# Patient Record
Sex: Female | Born: 1945 | Race: White | Hispanic: No | Marital: Married | Smoking: Never smoker
Health system: Southern US, Community
[De-identification: ages and names within clinical notes are randomized; demographics above are authoritative.]

## PROBLEM LIST (undated history)

## (undated) DIAGNOSIS — K219 Gastro-esophageal reflux disease without esophagitis: Secondary | ICD-10-CM

## (undated) DIAGNOSIS — E785 Hyperlipidemia, unspecified: Secondary | ICD-10-CM

## (undated) DIAGNOSIS — R42 Dizziness and giddiness: Secondary | ICD-10-CM

## (undated) DIAGNOSIS — F32A Depression, unspecified: Secondary | ICD-10-CM

## (undated) DIAGNOSIS — C50919 Malignant neoplasm of unspecified site of unspecified female breast: Secondary | ICD-10-CM

## (undated) DIAGNOSIS — R9431 Abnormal electrocardiogram [ECG] [EKG]: Secondary | ICD-10-CM

## (undated) DIAGNOSIS — I340 Nonrheumatic mitral (valve) insufficiency: Secondary | ICD-10-CM

## (undated) DIAGNOSIS — Z9119 Patient's noncompliance with other medical treatment and regimen: Secondary | ICD-10-CM

## (undated) DIAGNOSIS — E119 Type 2 diabetes mellitus without complications: Secondary | ICD-10-CM

## (undated) DIAGNOSIS — O2230 Deep phlebothrombosis in pregnancy, unspecified trimester: Secondary | ICD-10-CM

## (undated) DIAGNOSIS — Z923 Personal history of irradiation: Secondary | ICD-10-CM

## (undated) DIAGNOSIS — F419 Anxiety disorder, unspecified: Secondary | ICD-10-CM

## (undated) DIAGNOSIS — F329 Major depressive disorder, single episode, unspecified: Secondary | ICD-10-CM

## (undated) HISTORY — DX: Deep phlebothrombosis in pregnancy, unspecified trimester: O22.30

## (undated) HISTORY — DX: Nonrheumatic mitral (valve) insufficiency: I34.0

## (undated) HISTORY — DX: Dizziness and giddiness: R42

## (undated) HISTORY — DX: Anxiety disorder, unspecified: F41.9

## (undated) HISTORY — DX: Hyperlipidemia, unspecified: E78.5

## (undated) HISTORY — PX: OTHER SURGICAL HISTORY: SHX169

## (undated) HISTORY — DX: Gastro-esophageal reflux disease without esophagitis: K21.9

## (undated) HISTORY — PX: APPENDECTOMY: SHX54

## (undated) HISTORY — DX: Type 2 diabetes mellitus without complications: E11.9

## (undated) HISTORY — DX: Patient's noncompliance with other medical treatment and regimen: Z91.19

## (undated) HISTORY — DX: Major depressive disorder, single episode, unspecified: F32.9

## (undated) HISTORY — DX: Abnormal electrocardiogram (ECG) (EKG): R94.31

## (undated) HISTORY — DX: Depression, unspecified: F32.A

## (undated) HISTORY — PX: TUBAL LIGATION: SHX77

## (undated) NOTE — *Deleted (*Deleted)
[facility]  [address] [place],  [place]  [address] [phone]  Clinic Day:  05/25/2020  Referring physician: Carylon Perches, MD   This document serves as a record of services personally performed by Gery Pray, MD. It was created on their behalf by Curry,Lauren E, a trained medical scribe. The creation of this record is based on the scribe's personal observations and the provider's statements to them.   CHIEF COMPLAINT:  CC: History of stage I right breast cancer  Current Treatment:  Surveillance   HISTORY OF PRESENT ILLNESS:  Olivia Mora is a 45 y.o. female with a history of stage I right breast cancer was diagnosed in 2016 after she presented with bleeding from the nipple.  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  Estrogen receptor was positive at 100% and progesterone receptor positive at 50% with a Ki 67 of 20% and HER2 negative.  She had a left partial mastectomy by Dr. Ovidio Kin on May 13, 2015 with findings of a 5 mm invasive breast cancer of the right breast with 2 negative nodes for a T1a N0 M0, stage IA.  She was given radiation, which was completed in June of 2017.  She was tried on multiple types of hormonal therapy including anastrozole from February 2017 to May, then letrozole from June to August and then tamoxifen, which she later stopped approximately 9 months ago.  She did not tolerate the hormonal therapy.  She has never had any evidence of recurrence.  She had a mammogram on Nov 16, 2017 which was negative.  She had menarche at age 33 and menopause in her late 94's.  She was on hormone replacement therapy for 20 years.  She is here today for a follow up and states that she was in the emergency room back in June.  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  She has been having some dizziness recently, which could be caused by one of her medications.  She has requested a change in her nerve pill, and I discussed with her that she would need to follow up with her family doctor for this.  She has had some constipation, and I have recommended Mirilax to treat this.  Her appetite is good, and she has gained 2 pounds since her last visit.  She denies fever, or chills.  She denies sore throat, cough, and dyspnea.  She denies nausea, vomiting, and bowel issues.  She also denies chest pain, abdominal pain, or pain elsewhere.  She is on B12 injections monthly in addition to her usual medications.   INTERVAL HISTORY:  Olivia Mora is here for annual follow up and states that she has been well.  Annual mammogram from August was clear.   Her  appetite is good, and she has gained/lost _ pounds since her last visit.  She denies fever, chills or other signs of infection.  She denies nausea, vomiting, bowel issues, or abdominal pain.  She denies sore throat, cough, dyspnea, or chest pain.   REVIEW OF SYSTEMS:  Review of Systems - Oncology   VITALS:  There were no vitals taken for this visit.  Wt Readings from Last 3 Encounters:  09/12/17 162 lb 1.6 oz (73.5 kg)  02/23/17 163 lb (73.9  kg)  02/17/17 165 lb 12.8 oz (75.2 kg)    There is no height or weight on file to calculate BMI.  Performance status (ECOG): {CHL ONC [id number]  PHYSICAL EXAM:  Physical Exam  LABS:   CBC Latest Ref Rng & Units 05/22/2018 09/12/2017 02/13/2017  WBC 4.0 - 10.5 K/uL 4.8 6.8 5.2  Hemoglobin 12.0 - 15.0 g/dL 21.3 08.6 57.8  Hematocrit 36 - 46 % 37.9 39.4 40.3  Platelets 150 - 400 K/uL 250 241 168   CMP Latest Ref Rng & Units 05/22/2018 09/12/2017 02/23/2017  Glucose 70 - 99 mg/dL 469(G) 295(M) 841(L)  BUN 8 - 23 mg/dL 16 18 11   Creatinine 0.44 - 1.00 mg/dL 2.44 0.10 2.72  Sodium 135 - 145 mmol/L 143 137 144  Potassium 3.5 -  5.1 mmol/L 4.2 3.9 4.5  Chloride 98 - 111 mmol/L 110 101 107(H)  CO2 22 - 32 mmol/L 24 23 22   Calcium 8.9 - 10.3 mg/dL 9.3 9.0 9.4  Total Protein 6.5 - 8.1 g/dL 6.6 6.5 5.3(G)  Total Bilirubin 0.3 - 1.2 mg/dL 0.5 0.4 <6.4  Alkaline Phos 38 - 126 U/L 50 52 48  AST 15 - 41 U/L 20 23 17   ALT 0 - 44 U/L 17 17 14      No results found for: CEA1 / No results found for: CEA1 No results found for: PSA1 No results found for: QIH474 No results found for: CAN125  No results found for: TOTALPROTELP, ALBUMINELP, A1GS, A2GS, BETS, BETA2SER, GAMS, MSPIKE, SPEI Lab Results  Component Value Date   FERRITIN 54 02/23/2017   No results found for: LDH   STUDIES:   She underwent a digital diagnostic bilateral mammogram with tomography on 03/11/2020 showing: breast density category B.  No mammographic evidence of malignancy involving either breast.  Expected post lumpectomy changes involving the RIGHT breast.   Allergies: No Known Allergies  Current Medications: Current Outpatient Medications  Medication Sig Dispense Refill  . ALPRAZolam (XANAX) 1 MG tablet Take 1 mg by mouth at bedtime as needed for sleep. Reported on 11/26/2015    . cetirizine (ZYRTEC) 10 MG tablet Take 10 mg by mouth daily.    . cholecalciferol (VITAMIN D) 1000 units tablet Take 1,000 Units by mouth daily.    . clonazePAM (KLONOPIN) 1 MG tablet Take 1 mg by mouth 2 (two) times daily.     . Cyanocobalamin (VITAMIN B-12 IJ) Inject 1 mL as directed every 30 (thirty) days.     Marland Kitchen glipiZIDE (GLUCOTROL XL) 5 MG 24 hr tablet TK 1 T PO QD  4  . insulin glargine (LANTUS) 100 UNIT/ML injection Inject 40 Units into the skin daily.     Marland Kitchen LANTUS SOLOSTAR 100 UNIT/ML Solostar Pen INJECT 40 UNITS EVERY MORNING  12  . meclizine (ANTIVERT) 25 MG tablet TAKE 1 TABLET BY MOUTH THREE TIMES DAILY AS NEEDED FOR DIZZINESS  5  . metFORMIN (GLUCOPHAGE) 1000 MG tablet Take 1,000 mg by mouth 2 (two) times daily with a meal. Alternates 1-2 a day    .  Multiple Minerals-Vitamins (CALCIUM & VIT D3 BONE HEALTH PO) Take 1 tablet by mouth daily.    . simvastatin (ZOCOR) 40 MG tablet Take 40 mg by mouth every evening. Reported on 09/25/2015    . tamoxifen (NOLVADEX) 20 MG tablet Take 1 tablet (20 mg total) by mouth daily. 90 tablet 3  . traZODone (DESYREL) 100 MG tablet Take 100-200 mg by mouth at bedtime.     Marland Kitchen venlafaxine  XR (EFFEXOR-XR) 75 MG 24 hr capsule 3  qam 90 capsule 3  . XARELTO 20 MG TABS tablet Take 20 mg by mouth daily.  12   No current facility-administered medications for this visit.     ASSESSMENT & PLAN:   Assessment:   1. Stage IA invasive right breast cancer diagnosed in 2016 and treated with surgery, radiation and partial hormonal therapy.  She has no evidence of disease.  2. Severe hot flashes despite Effexor at 150 mg daily.  We have added clonidine 0.1 mg daily.  3. 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  She is currently on Xarelto and is negative for any hereditary hypercoagulability.  4. Chronic depression which is worsened by the fact that she is now widowed this year and grieving.  5. Hip pain which appears to be osteoarthritis.  I will refer her to the orthopedic surgeon for evaluation and treatment.  Plan: She was unable to tolerate hormonal therapy but fortunately had a very small 5 mm lesion and should do well regardless.  Since she is 4 years out from her diagnoses, we can switch her to yearly visits.  I have referred her to Dr. Loralie Champagne.  I can see her back in 1 year with bilateral mammogram.  She understands and agrees with this plan of care.     I provided *** minutes (8:44 AM - 8:44 AM) of face-to-face time during this this encounter and > 50% was spent counseling as documented under my assessment and plan.    Olivia Beckwith, MD [facility] AT [facility] [address] [place] [address] Dept: [phone] Dept Fax:  [phone]   I, Foye Deer, am acting as scribe for Olivia Beckwith, MD  I have reviewed this report as typed by the medical scribe, and it is complete and accurate.

---

## 2001-01-29 ENCOUNTER — Encounter: Payer: Self-pay | Admitting: Family Medicine

## 2001-01-29 ENCOUNTER — Emergency Department (HOSPITAL_COMMUNITY): Admission: EM | Admit: 2001-01-29 | Discharge: 2001-01-29 | Payer: Self-pay | Admitting: Emergency Medicine

## 2001-01-29 ENCOUNTER — Ambulatory Visit (HOSPITAL_COMMUNITY): Admission: RE | Admit: 2001-01-29 | Discharge: 2001-01-29 | Payer: Self-pay | Admitting: Family Medicine

## 2001-02-06 ENCOUNTER — Ambulatory Visit (HOSPITAL_COMMUNITY): Admission: RE | Admit: 2001-02-06 | Discharge: 2001-02-06 | Payer: Self-pay | Admitting: Internal Medicine

## 2001-05-29 ENCOUNTER — Ambulatory Visit (HOSPITAL_COMMUNITY): Admission: RE | Admit: 2001-05-29 | Discharge: 2001-05-29 | Payer: Self-pay | Admitting: Family Medicine

## 2002-02-21 ENCOUNTER — Emergency Department (HOSPITAL_COMMUNITY): Admission: EM | Admit: 2002-02-21 | Discharge: 2002-02-21 | Payer: Self-pay | Admitting: Emergency Medicine

## 2002-02-22 ENCOUNTER — Ambulatory Visit (HOSPITAL_COMMUNITY): Admission: RE | Admit: 2002-02-22 | Discharge: 2002-02-22 | Payer: Self-pay | Admitting: Emergency Medicine

## 2002-02-22 ENCOUNTER — Encounter: Payer: Self-pay | Admitting: Emergency Medicine

## 2003-10-03 ENCOUNTER — Emergency Department (HOSPITAL_COMMUNITY): Admission: EM | Admit: 2003-10-03 | Discharge: 2003-10-03 | Payer: Self-pay | Admitting: Emergency Medicine

## 2004-03-30 ENCOUNTER — Ambulatory Visit (HOSPITAL_COMMUNITY): Admission: RE | Admit: 2004-03-30 | Discharge: 2004-03-30 | Payer: Self-pay | Admitting: Family Medicine

## 2004-11-02 ENCOUNTER — Ambulatory Visit (HOSPITAL_COMMUNITY): Admission: RE | Admit: 2004-11-02 | Discharge: 2004-11-02 | Payer: Self-pay | Admitting: Specialist

## 2004-11-02 IMAGING — US US PELVIS COMPLETE MODIFY
1 series · 14 of 25 positions shown · non-contrast
Comparison: none

CLINICAL DATA: Heavy periods. Postmenopausal bleeding.

[Series 1: unknown · 0.40mm/px · 14 of 60 slices shown]
[im 1/60]
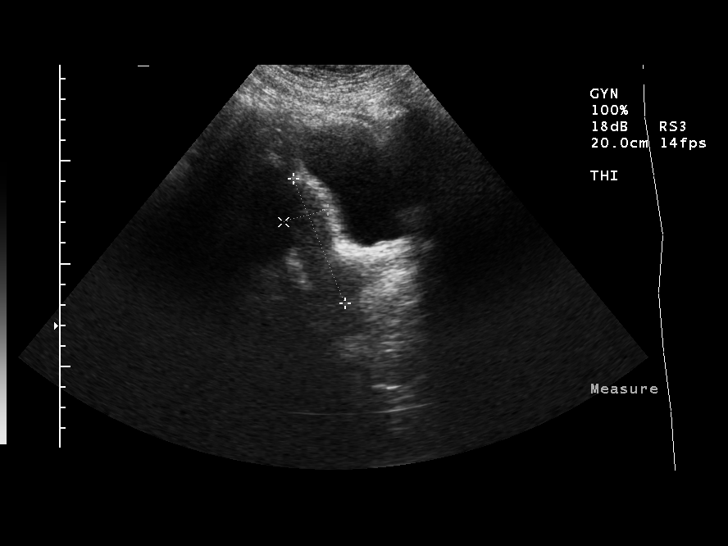
[im 5/60]
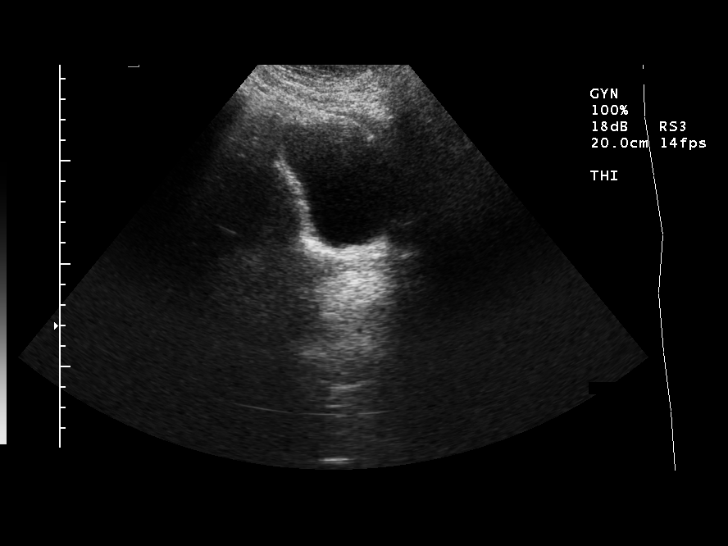
[im 10/60]
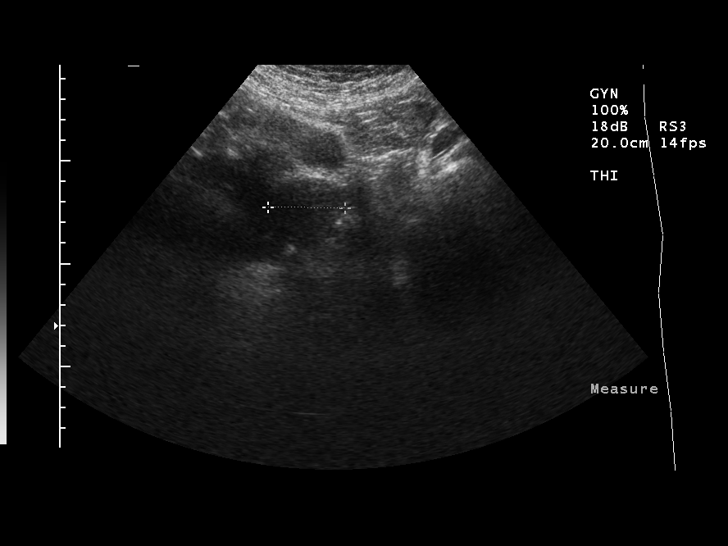
[im 15/60]
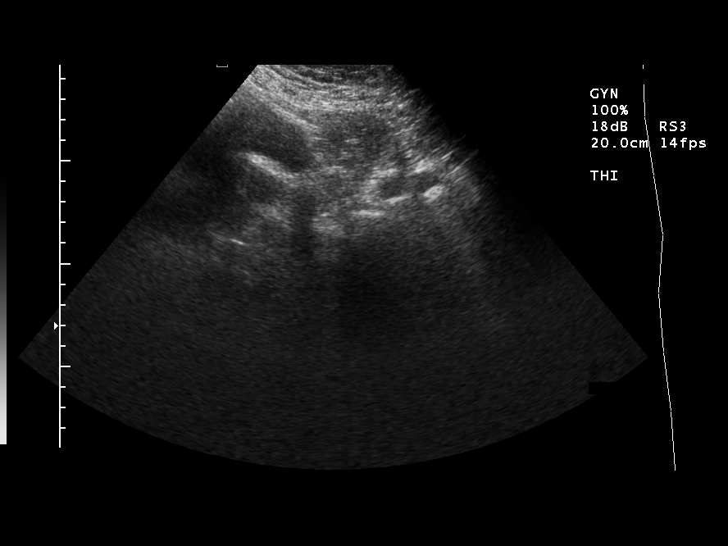
[im 20/60]
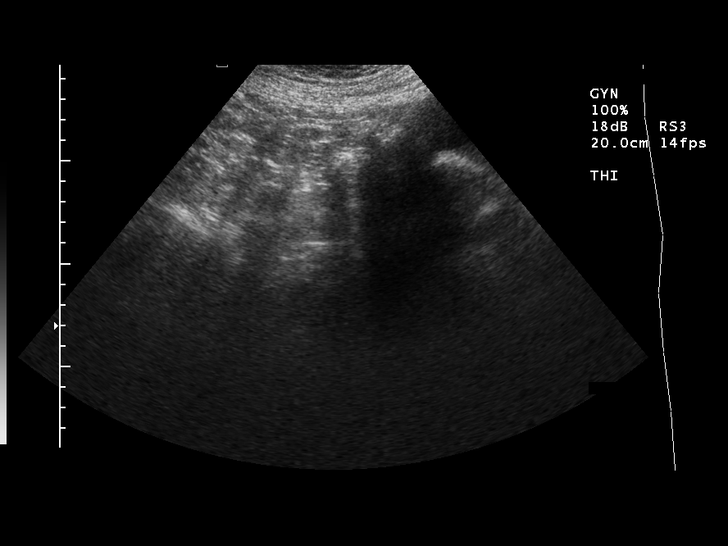
[im 23/60]
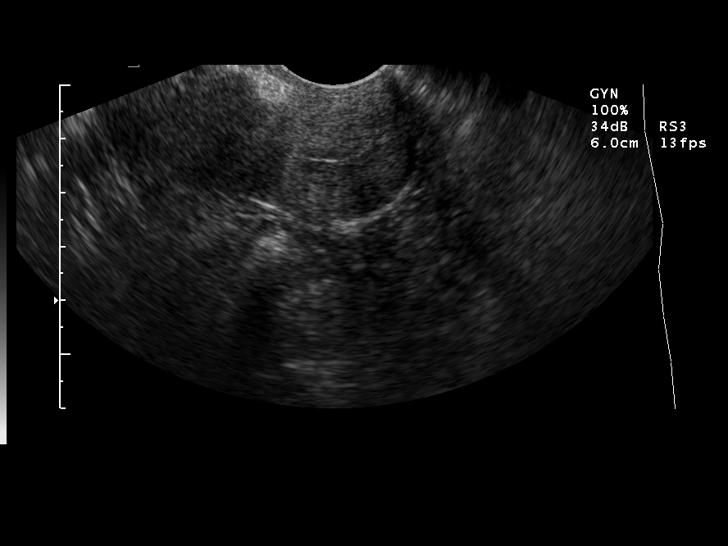
[im 28/60]
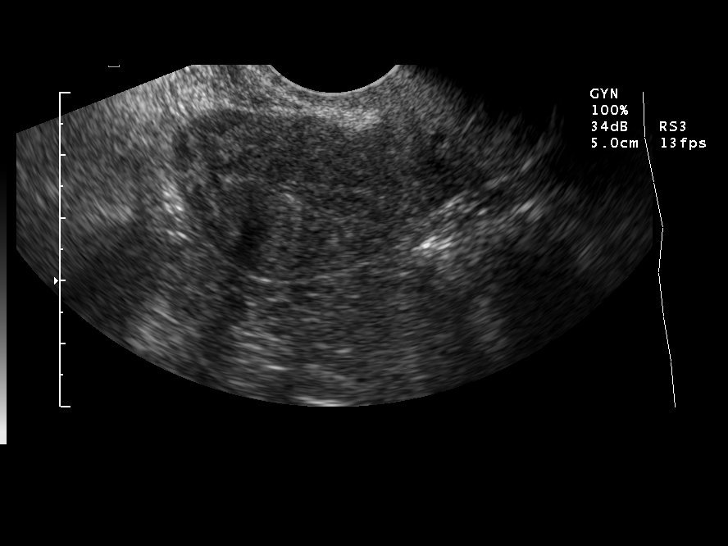
[im 32/60]
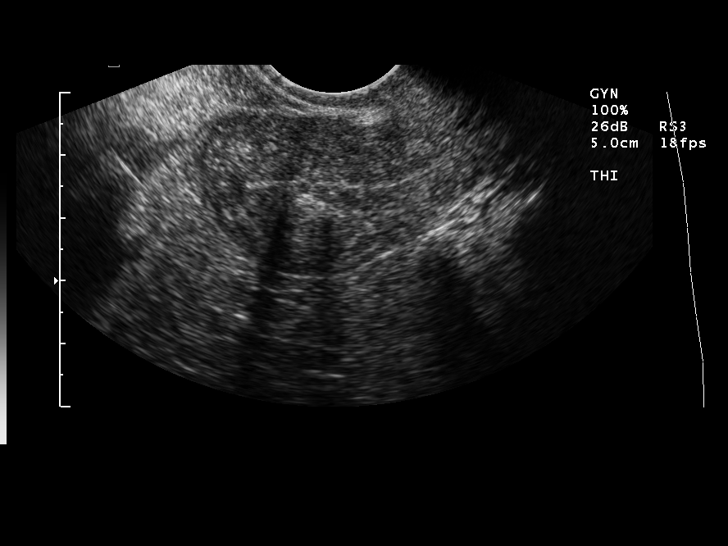
[im 37/60]
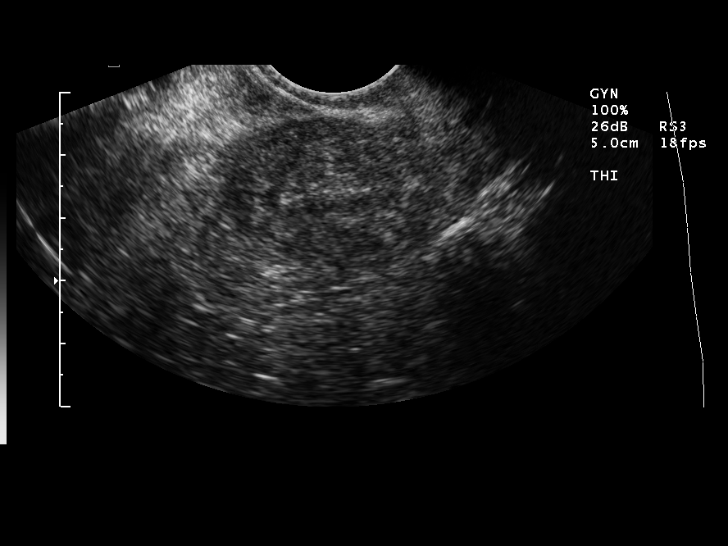
[im 40/60]
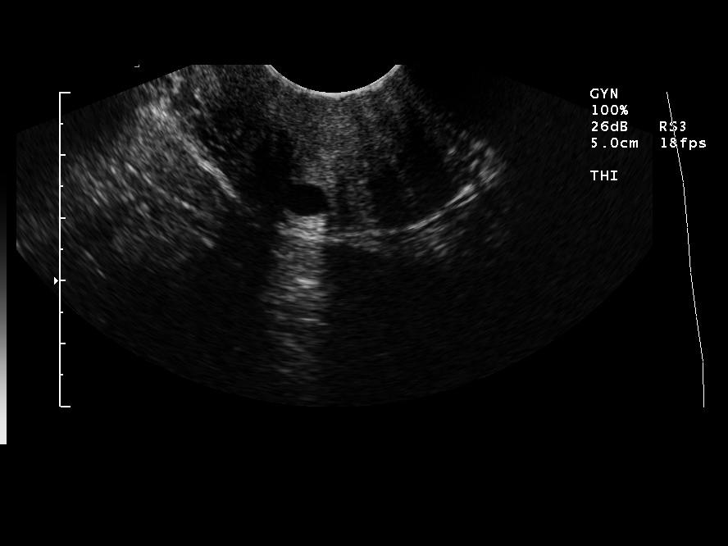
[im 45/60]
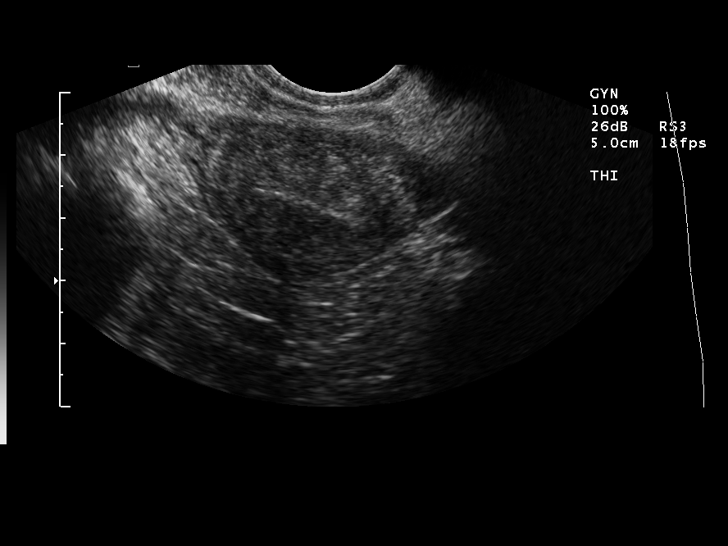
[im 50/60]
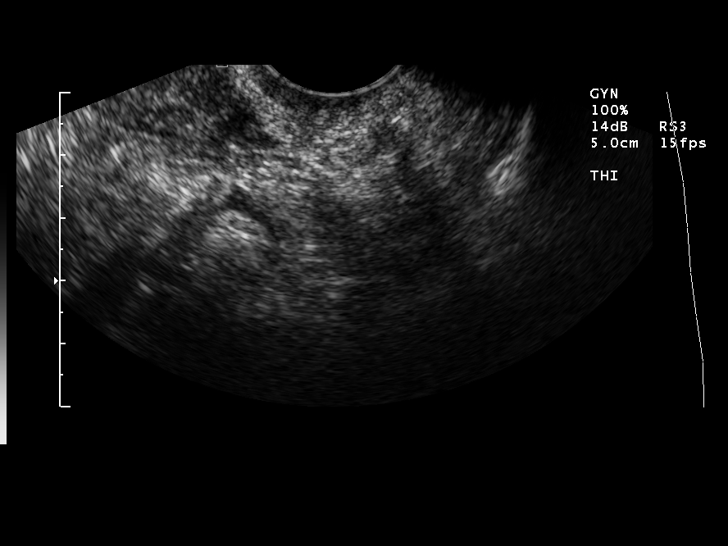
[im 55/60]
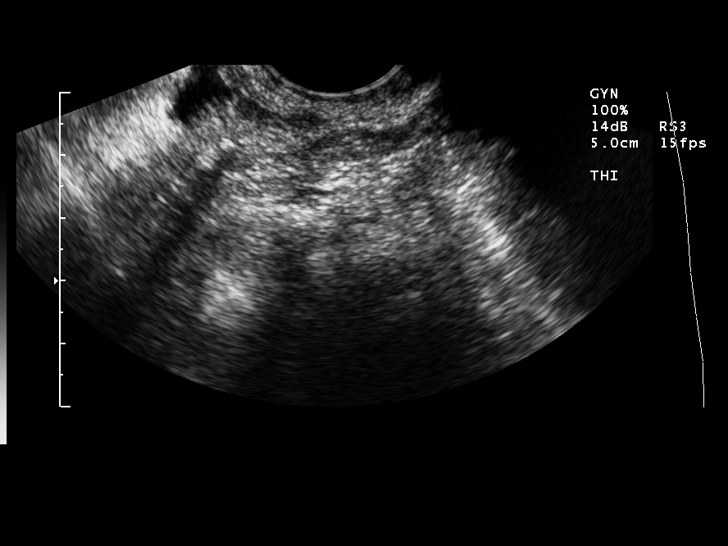
[im 60/60]
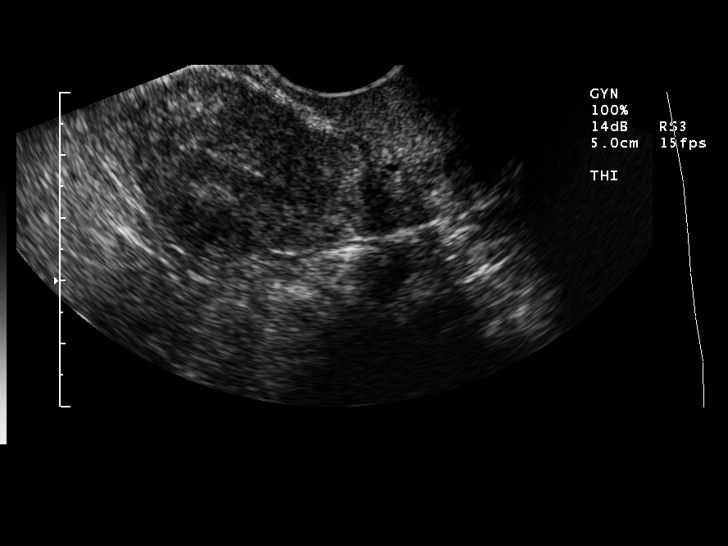

[14 of 25 positions shown; findings below may reference images not displayed]

Pelvic and transvaginal ultrasound:

Uterus measures 2.3 x 3.7 x 6.6 cm, with an unremarkable appearing endometrial
stripe 2.3 mm in total thickness. There is a partially calcified 11 x 14 mm mass
probably fibroid in the posterior wall of the uterus. No other discrete masses
are identified. Neither ovary is seen on pelvic or transvaginal scanning. No
adnexal masses evident. No free fluid identified.
IMPRESSION: 1. Probable 14 mm degenerating intramural uterine fibroid. This does not appear
to significantly distort  the endometrial cavity.
2. Ovaries are not identified. If there is   clinical concern of ovarian
pathology, MRI may be useful for further evaluation.

## 2007-03-09 ENCOUNTER — Ambulatory Visit (HOSPITAL_COMMUNITY): Admission: RE | Admit: 2007-03-09 | Discharge: 2007-03-09 | Payer: Self-pay | Admitting: Internal Medicine

## 2008-06-13 ENCOUNTER — Ambulatory Visit (HOSPITAL_COMMUNITY): Admission: RE | Admit: 2008-06-13 | Discharge: 2008-06-13 | Payer: Self-pay | Admitting: Internal Medicine

## 2010-08-08 ENCOUNTER — Encounter: Payer: Self-pay | Admitting: Internal Medicine

## 2012-10-13 ENCOUNTER — Encounter: Payer: Self-pay | Admitting: Physician Assistant

## 2012-10-14 DIAGNOSIS — R9431 Abnormal electrocardiogram [ECG] [EKG]: Secondary | ICD-10-CM

## 2012-10-15 ENCOUNTER — Encounter: Payer: Self-pay | Admitting: Physician Assistant

## 2012-10-19 ENCOUNTER — Encounter: Payer: Self-pay | Admitting: Cardiology

## 2012-10-19 ENCOUNTER — Ambulatory Visit (INDEPENDENT_AMBULATORY_CARE_PROVIDER_SITE_OTHER): Payer: Medicare Other | Admitting: *Deleted

## 2012-10-19 DIAGNOSIS — Z7901 Long term (current) use of anticoagulants: Secondary | ICD-10-CM

## 2012-10-19 DIAGNOSIS — I2699 Other pulmonary embolism without acute cor pulmonale: Secondary | ICD-10-CM

## 2012-10-19 DIAGNOSIS — I4891 Unspecified atrial fibrillation: Secondary | ICD-10-CM

## 2012-10-19 LAB — POCT INR: INR: 1.7

## 2012-10-22 ENCOUNTER — Ambulatory Visit (INDEPENDENT_AMBULATORY_CARE_PROVIDER_SITE_OTHER): Payer: Medicare Other | Admitting: *Deleted

## 2012-10-22 ENCOUNTER — Telehealth: Payer: Self-pay | Admitting: *Deleted

## 2012-10-22 DIAGNOSIS — I4891 Unspecified atrial fibrillation: Secondary | ICD-10-CM

## 2012-10-22 DIAGNOSIS — I2699 Other pulmonary embolism without acute cor pulmonale: Secondary | ICD-10-CM

## 2012-10-22 DIAGNOSIS — Z7901 Long term (current) use of anticoagulants: Secondary | ICD-10-CM

## 2012-10-22 NOTE — Telephone Encounter (Signed)
Called pt back.  She is keeping appt for today.

## 2012-10-22 NOTE — Telephone Encounter (Signed)
Please return call / tgs °

## 2012-10-30 ENCOUNTER — Ambulatory Visit (INDEPENDENT_AMBULATORY_CARE_PROVIDER_SITE_OTHER): Payer: Medicare Other | Admitting: *Deleted

## 2012-10-30 DIAGNOSIS — Z7901 Long term (current) use of anticoagulants: Secondary | ICD-10-CM

## 2012-10-30 DIAGNOSIS — I2699 Other pulmonary embolism without acute cor pulmonale: Secondary | ICD-10-CM

## 2012-10-30 DIAGNOSIS — I4891 Unspecified atrial fibrillation: Secondary | ICD-10-CM

## 2012-11-01 ENCOUNTER — Telehealth: Payer: Self-pay | Admitting: Physician Assistant

## 2012-11-01 NOTE — Telephone Encounter (Signed)
PATIENT HAS NOT RECEIVED A MONITOR TO WEAR

## 2012-11-05 ENCOUNTER — Encounter: Payer: Self-pay | Admitting: *Deleted

## 2012-11-05 NOTE — Telephone Encounter (Signed)
Mailbox full cannot accept messages

## 2012-11-06 NOTE — Telephone Encounter (Signed)
Discussed below with patient.  States saw her PMD (Dr. Cliffton Asters - [facility]) yesterday & they put a monitor on her.  States she will wear x 2 weeks.  Changed eph from 4/28 to 5/12 to allow for monitor to be completed.  Will have her INR checked again at this OV as she can not afford to pay $45 every time she comes.  States she is looking for new PMD her in [place].

## 2012-11-12 ENCOUNTER — Encounter: Payer: Medicare Other | Admitting: Physician Assistant

## 2012-11-26 ENCOUNTER — Ambulatory Visit (INDEPENDENT_AMBULATORY_CARE_PROVIDER_SITE_OTHER): Payer: Medicare Other | Admitting: Physician Assistant

## 2012-11-26 ENCOUNTER — Ambulatory Visit (INDEPENDENT_AMBULATORY_CARE_PROVIDER_SITE_OTHER): Payer: Medicare Other | Admitting: *Deleted

## 2012-11-26 ENCOUNTER — Encounter: Payer: Self-pay | Admitting: Physician Assistant

## 2012-11-26 VITALS — BP 108/70 | HR 77 | Ht 64.0 in | Wt 166.4 lb

## 2012-11-26 DIAGNOSIS — Z7901 Long term (current) use of anticoagulants: Secondary | ICD-10-CM

## 2012-11-26 DIAGNOSIS — I2699 Other pulmonary embolism without acute cor pulmonale: Secondary | ICD-10-CM

## 2012-11-26 DIAGNOSIS — I059 Rheumatic mitral valve disease, unspecified: Secondary | ICD-10-CM

## 2012-11-26 DIAGNOSIS — I4891 Unspecified atrial fibrillation: Secondary | ICD-10-CM

## 2012-11-26 DIAGNOSIS — R9431 Abnormal electrocardiogram [ECG] [EKG]: Secondary | ICD-10-CM | POA: Insufficient documentation

## 2012-11-26 DIAGNOSIS — I340 Nonrheumatic mitral (valve) insufficiency: Secondary | ICD-10-CM | POA: Insufficient documentation

## 2012-11-26 NOTE — Assessment & Plan Note (Signed)
Assessed as mild/moderate, by recent echocardiogram. Continue to monitor clinically.

## 2012-11-26 NOTE — Assessment & Plan Note (Addendum)
No further workup indicated. Results of outpatient event monitoring have been reviewed, revealing no evidence of dysrhythmia or heart block. Recent echo indicated NL LVF. Therefore, this suggests that the original EKG, which prompted the cardiac evaluation, was a misreading by the computer and, in fact, artifactual, as determined at time of initial consultation.

## 2012-11-26 NOTE — Assessment & Plan Note (Signed)
Patient is currently being followed here in our Coumadin clinic for ongoing monitoring/management of recently documented pulmonary embolus. No evidence of RV strain by echocardiography. Recommend that she continue treatment for a full 6 months. Of note, however, she also has history of remote DVT, previously treated with Coumadin.

## 2012-11-26 NOTE — Patient Instructions (Signed)
Continue all current medications. Your physician wants you to follow up in: 6 months.  You will receive a reminder letter in the mail one-two months in advance.  If you don't receive a letter, please call our office to schedule the follow up appointment   

## 2012-11-26 NOTE — Progress Notes (Signed)
Primary Cardiologist: Simona Huh, MD (new)   HPI: Post hospital followup from [facility], status post recent evaluation of an abnormal EKG.   Patient was initially seen in consultation by the cardiology Fellow, Dr. Hilary Hertz, on April 30th, who felt that the EKG most likely represented artifact, rather than true complete heart block. There was no subsequent demonstrated dysrhythmia on telemetry. Patient presented with multiple CRFs, but no known ischemic heart disease, and serial cardiac markers were all within NL limits. Echocardiogram was obtained, results as follows:   - EF 50-55% with diastolic dysfunction; mild/moderate MR; PHTN (RVSP 25-30 mmHg); normal RVF  Of note, patient was diagnosed with acute pulmonary embolus, following elevated d-dimer. She also had history of remote DVT, previously treated with Coumadin.  We recommended further outpatient evaluation with a 21 day event monitor. Rhythm strips were reviewed today, revealing NSR and ST, but no atrial dysrhythmia or evidence of heart block.   Patient presents today reporting no exertional CP, DOE, or tachycardia palpitations. She has since established here in our Coumadin clinic, for ongoing monitoring/management. She is also in the process of establishing with Dr. Carylon Perches in [place], [place], as her new primary M.D., after moving here to [facility] from [place], approximately 6 months ago.  No Known Allergies  Current Outpatient Prescriptions  Medication Sig Dispense Refill  . ALPRAZolam (XANAX) 1 MG tablet Take 1 mg by mouth 3 (three) times daily as needed for sleep.      . clonazePAM (KLONOPIN) 1 MG tablet Take 1 mg by mouth 4 (four) times daily.      . Cyanocobalamin (VITAMIN B-12 IJ) Inject 1 mL as directed every 30 (thirty) days.       . folic acid (FOLVITE) 1 MG tablet Take 1 mg by mouth daily.      Marland Kitchen glipiZIDE (GLUCOTROL) 10 MG tablet Take 10 mg by mouth 2 (two) times daily before a meal.      . HYDROcodone-acetaminophen (NORCO)  10-325 MG per tablet Take 1 tablet by mouth every 6 (six) hours as needed for pain.      . hydrOXYzine (ATARAX/VISTARIL) 10 MG tablet Take 10 mg by mouth daily.      . insulin glargine (LANTUS) 100 UNIT/ML injection Inject 18 Units into the skin at bedtime.      . metFORMIN (GLUCOPHAGE) 1000 MG tablet Take 1,000 mg by mouth 2 (two) times daily with a meal.      . ranitidine (ZANTAC) 150 MG tablet Take 150 mg by mouth as needed for heartburn.      . simvastatin (ZOCOR) 40 MG tablet Take 40 mg by mouth every evening.      . traZODone (DESYREL) 100 MG tablet Take 100 mg by mouth at bedtime.       No current facility-administered medications for this visit.    Past Medical History  Diagnosis Date  . Diabetes   . Hyperlipidemia   . Anxiety   . GERD (gastroesophageal reflux disease)   . DVT (deep vein thrombosis) in pregnancy   . Abnormal EKG   . Mitral regurgitation     mild/moderate, echo, 10/2011    No past surgical history on file.  History   Social History  . Marital Status: Married    Spouse Name: N/A    Number of Children: N/A  . Years of Education: N/A   Occupational History  . Not on file.   Social History Main Topics  . Smoking status: Never Smoker   .  Smokeless tobacco: Not on file  . Alcohol Use: Not on file  . Drug Use: Not on file  . Sexually Active: Not on file   Other Topics Concern  . Not on file   Social History Narrative  . No narrative on file    Family History  Problem Relation Age of Onset  . Heart attack Brother 45  . CAD      Several family memebers  . Heart disease      Several family memebers    ROS: no nausea, vomiting; no fever, chills; no melena, hematochezia; no claudication  PHYSICAL EXAM: BP 108/70  Pulse 77  Ht 5\' 4"  (1.626 m)  Wt 166 lb 6.4 oz (75.479 kg)  BMI 28.55 kg/m2  SpO2 96% GENERAL: 67 year old female; NAD HEENT: NCAT, PERRLA, EOMI; sclera clear; no xanthelasma NECK: palpable bilateral carotid pulses, no bruits;  no JVD; no TM LUNGS: CTA bilaterally CARDIAC: RRR (S1, S2); no significant murmurs; no rubs or gallops ABDOMEN: soft, non-tender; intact BS EXTREMETIES: no significant peripheral edema SKIN: warm/dry; no obvious rash/lesions MUSCULOSKELETAL: no joint deformity NEURO: no focal deficit; NL affect   EKG:    ASSESSMENT & PLAN:  Abnormal EKG No further workup indicated. Results of outpatient event monitoring have been reviewed, revealing no evidence of dysrhythmia or heart block. Recent echo indicated NL LVF. Therefore, this suggests that the original EKG, which prompted the cardiac evaluation, was a misreading by the computer and, in fact, artifactual, as determined at time of initial consultation.   Mitral regurgitation Assessed as mild/moderate, by recent echocardiogram. Continue to monitor clinically.  Other pulmonary embolism and infarction Patient is currently being followed here in our Coumadin clinic for ongoing monitoring/management of recently documented pulmonary embolus. No evidence of RV strain by echocardiography. Recommend that she continue treatment for a full 6 months. Of note, however, she also has history of remote DVT, previously treated with Coumadin.    Gene Julias Mould, PAC

## 2012-11-29 ENCOUNTER — Other Ambulatory Visit: Payer: Self-pay | Admitting: *Deleted

## 2012-11-29 DIAGNOSIS — R002 Palpitations: Secondary | ICD-10-CM

## 2012-12-11 ENCOUNTER — Ambulatory Visit (INDEPENDENT_AMBULATORY_CARE_PROVIDER_SITE_OTHER): Payer: Medicare Other | Admitting: *Deleted

## 2012-12-11 DIAGNOSIS — I2699 Other pulmonary embolism without acute cor pulmonale: Secondary | ICD-10-CM

## 2012-12-11 DIAGNOSIS — I4891 Unspecified atrial fibrillation: Secondary | ICD-10-CM

## 2012-12-11 DIAGNOSIS — Z7901 Long term (current) use of anticoagulants: Secondary | ICD-10-CM

## 2012-12-24 DIAGNOSIS — R42 Dizziness and giddiness: Secondary | ICD-10-CM

## 2013-01-01 ENCOUNTER — Encounter: Payer: Self-pay | Admitting: *Deleted

## 2013-05-14 ENCOUNTER — Other Ambulatory Visit: Payer: Self-pay | Admitting: Obstetrics & Gynecology

## 2013-06-10 ENCOUNTER — Ambulatory Visit (INDEPENDENT_AMBULATORY_CARE_PROVIDER_SITE_OTHER): Payer: Medicare Other | Admitting: Obstetrics & Gynecology

## 2013-06-10 ENCOUNTER — Other Ambulatory Visit (HOSPITAL_COMMUNITY)
Admission: RE | Admit: 2013-06-10 | Discharge: 2013-06-10 | Disposition: A | Discharge status: Home / Self Care | Payer: Medicare Other | Source: Ambulatory Visit | Attending: Obstetrics & Gynecology | Admitting: Obstetrics & Gynecology

## 2013-06-10 ENCOUNTER — Encounter: Payer: Self-pay | Admitting: Obstetrics & Gynecology

## 2013-06-10 VITALS — BP 110/70 | Ht 63.4 in | Wt 162.4 lb

## 2013-06-10 DIAGNOSIS — Z1212 Encounter for screening for malignant neoplasm of rectum: Secondary | ICD-10-CM

## 2013-06-10 DIAGNOSIS — Z01419 Encounter for gynecological examination (general) (routine) without abnormal findings: Secondary | ICD-10-CM

## 2013-06-10 DIAGNOSIS — Z124 Encounter for screening for malignant neoplasm of cervix: Secondary | ICD-10-CM

## 2013-06-10 NOTE — Addendum Note (Signed)
Addended by: Richardson Chiquito on: 06/10/2013 04:30 PM   Modules accepted: Orders

## 2013-06-10 NOTE — Progress Notes (Signed)
Patient ID: Olivia Mora, female   DOB: January 30, 1946, 67 y.o.   MRN: [id number] Subjective:     Olivia Mora is a 67 y.o. female here for a routine exam.  No LMP recorded. No obstetric history on file. Birth Control Method:  Post menopausal Menstrual Calendar(currently): pm  Current complaints: none.   Current acute medical issues:  none   Recent Gynecologic History No LMP recorded. Last Pap: 2013,  normal Last mammogram: 2013,  normal  Past Medical History  Diagnosis Date  . Diabetes   . Hyperlipidemia   . Anxiety   . GERD (gastroesophageal reflux disease)   . DVT (deep vein thrombosis) in pregnancy   . Abnormal EKG   . Mitral regurgitation     mild/moderate, echo, 10/2011    History reviewed. No pertinent past surgical history.  OB History   Grav Para Term Preterm Abortions TAB SAB Ect Mult Living                  History   Social History  . Marital Status: Married    Spouse Name: N/A    Number of Children: N/A  . Years of Education: N/A   Social History Main Topics  . Smoking status: Never Smoker   . Smokeless tobacco: None  . Alcohol Use: None  . Drug Use: None  . Sexual Activity: None   Other Topics Concern  . None   Social History Narrative  . None    Family History  Problem Relation Age of Onset  . Heart attack Brother 45  . CAD      Several family memebers  . Heart disease      Several family memebers     Review of Systems  Review of Systems  Constitutional: Negative for fever, chills, weight loss, malaise/fatigue and diaphoresis.  HENT: Negative for hearing loss, ear pain, nosebleeds, congestion, sore throat, neck pain, tinnitus and ear discharge.   Eyes: Negative for blurred vision, double vision, photophobia, pain, discharge and redness.  Respiratory: Negative for cough, hemoptysis, sputum production, shortness of breath, wheezing and stridor.   Cardiovascular: Negative for chest pain, palpitations, orthopnea, claudication, leg  swelling and PND.  Gastrointestinal: negative for abdominal pain. Negative for heartburn, nausea, vomiting, diarrhea, constipation, blood in stool and melena.  Genitourinary: Negative for dysuria, urgency, frequency, hematuria and flank pain.  Musculoskeletal: Negative for myalgias, back pain, joint pain and falls.  Skin: Negative for itching and rash.  Neurological: Negative for dizziness, tingling, tremors, sensory change, speech change, focal weakness, seizures, loss of consciousness, weakness and headaches.  Endo/Heme/Allergies: Negative for environmental allergies and polydipsia. Does not bruise/bleed easily.  Psychiatric/Behavioral: Negative for depression, suicidal ideas, hallucinations, memory loss and substance abuse. The patient is not nervous/anxious and does not have insomnia.        Objective:    Physical Exam  Vitals reviewed. Constitutional: She is oriented to person, place, and time. She appears well-developed and well-nourished.  HENT:  Head: Normocephalic and atraumatic.        Right Ear: External ear normal.  Left Ear: External ear normal.  Nose: Nose normal.  Mouth/Throat: Oropharynx is clear and moist.  Eyes: Conjunctivae and EOM are normal. Pupils are equal, round, and reactive to light. Right eye exhibits no discharge. Left eye exhibits no discharge. No scleral icterus.  Neck: Normal range of motion. Neck supple. No tracheal deviation present. No thyromegaly present.  Cardiovascular: Normal rate, regular rhythm, normal heart sounds and intact distal pulses.  Exam reveals no gallop and no friction rub.   No murmur heard. Respiratory: Effort normal and breath sounds normal. No respiratory distress. She has no wheezes. She has no rales. She exhibits no tenderness.  GI: Soft. Bowel sounds are normal. She exhibits no distension and no mass. There is no tenderness. There is no rebound and no guarding.  Genitourinary:  Breasts no masses skin changes or nipple changes  bilaterally      Vulva is normal without lesions Vagina is pink moist without discharge Cervix normal in appearance and pap is done Uterus is normal size shape and contour Adnexa is negative with normal sized ovaries  Rectal    hemoccult negative, normal tone, no masses  Musculoskeletal: Normal range of motion. She exhibits no edema and no tenderness.  Neurological: She is alert and oriented to person, place, and time. She has normal reflexes. She displays normal reflexes. No cranial nerve deficit. She exhibits normal muscle tone. Coordination normal.  Skin: Skin is warm and dry. No rash noted. No erythema. No pallor.  Psychiatric: She has a normal mood and affect. Her behavior is normal. Judgment and thought content normal.       Assessment:    Healthy female exam.    Plan:    Follow up in: 1 year.

## 2014-01-07 ENCOUNTER — Other Ambulatory Visit (HOSPITAL_COMMUNITY): Payer: Self-pay | Admitting: Internal Medicine

## 2014-01-07 DIAGNOSIS — Z1231 Encounter for screening mammogram for malignant neoplasm of breast: Secondary | ICD-10-CM

## 2014-01-13 ENCOUNTER — Ambulatory Visit (HOSPITAL_COMMUNITY): Payer: Medicare Other

## 2014-01-20 ENCOUNTER — Ambulatory Visit (HOSPITAL_COMMUNITY)
Admission: RE | Admit: 2014-01-20 | Discharge: 2014-01-20 | Disposition: A | Discharge status: Home / Self Care | Payer: Medicare Other | Source: Ambulatory Visit | Attending: Internal Medicine | Admitting: Internal Medicine

## 2014-01-20 ENCOUNTER — Ambulatory Visit (HOSPITAL_COMMUNITY): Payer: Medicare Other

## 2014-01-20 DIAGNOSIS — Z1231 Encounter for screening mammogram for malignant neoplasm of breast: Secondary | ICD-10-CM | POA: Insufficient documentation

## 2014-07-18 DIAGNOSIS — C50919 Malignant neoplasm of unspecified site of unspecified female breast: Secondary | ICD-10-CM

## 2014-07-18 DIAGNOSIS — Z923 Personal history of irradiation: Secondary | ICD-10-CM

## 2014-07-18 HISTORY — DX: Malignant neoplasm of unspecified site of unspecified female breast: C50.919

## 2014-07-18 HISTORY — PX: BREAST LUMPECTOMY: SHX2

## 2014-07-18 HISTORY — DX: Personal history of irradiation: Z92.3

## 2014-08-18 DIAGNOSIS — Z7901 Long term (current) use of anticoagulants: Secondary | ICD-10-CM | POA: Diagnosis not present

## 2014-09-05 DIAGNOSIS — Z86711 Personal history of pulmonary embolism: Secondary | ICD-10-CM | POA: Diagnosis not present

## 2014-09-05 DIAGNOSIS — F419 Anxiety disorder, unspecified: Secondary | ICD-10-CM | POA: Diagnosis not present

## 2014-09-05 DIAGNOSIS — Z23 Encounter for immunization: Secondary | ICD-10-CM | POA: Diagnosis not present

## 2014-09-05 DIAGNOSIS — E119 Type 2 diabetes mellitus without complications: Secondary | ICD-10-CM | POA: Diagnosis not present

## 2014-10-14 ENCOUNTER — Emergency Department (HOSPITAL_COMMUNITY)
Admission: EM | Admit: 2014-10-14 | Discharge: 2014-10-15 | Disposition: A | Discharge status: Home / Self Care | Payer: Medicare Other | Attending: Emergency Medicine | Admitting: Emergency Medicine

## 2014-10-14 ENCOUNTER — Emergency Department (HOSPITAL_COMMUNITY): Payer: Medicare Other

## 2014-10-14 DIAGNOSIS — R2990 Unspecified symptoms and signs involving the nervous system: Secondary | ICD-10-CM | POA: Diagnosis not present

## 2014-10-14 DIAGNOSIS — K219 Gastro-esophageal reflux disease without esophagitis: Secondary | ICD-10-CM | POA: Diagnosis not present

## 2014-10-14 DIAGNOSIS — E119 Type 2 diabetes mellitus without complications: Secondary | ICD-10-CM | POA: Insufficient documentation

## 2014-10-14 DIAGNOSIS — Z7901 Long term (current) use of anticoagulants: Secondary | ICD-10-CM | POA: Diagnosis not present

## 2014-10-14 DIAGNOSIS — R2689 Other abnormalities of gait and mobility: Secondary | ICD-10-CM | POA: Diagnosis not present

## 2014-10-14 DIAGNOSIS — R42 Dizziness and giddiness: Secondary | ICD-10-CM | POA: Diagnosis not present

## 2014-10-14 DIAGNOSIS — E785 Hyperlipidemia, unspecified: Secondary | ICD-10-CM | POA: Diagnosis not present

## 2014-10-14 DIAGNOSIS — M7989 Other specified soft tissue disorders: Secondary | ICD-10-CM | POA: Diagnosis not present

## 2014-10-14 DIAGNOSIS — Z86718 Personal history of other venous thrombosis and embolism: Secondary | ICD-10-CM | POA: Diagnosis not present

## 2014-10-14 DIAGNOSIS — Z794 Long term (current) use of insulin: Secondary | ICD-10-CM | POA: Diagnosis not present

## 2014-10-14 DIAGNOSIS — Z79899 Other long term (current) drug therapy: Secondary | ICD-10-CM | POA: Insufficient documentation

## 2014-10-14 DIAGNOSIS — R410 Disorientation, unspecified: Secondary | ICD-10-CM | POA: Insufficient documentation

## 2014-10-14 DIAGNOSIS — H9192 Unspecified hearing loss, left ear: Secondary | ICD-10-CM | POA: Insufficient documentation

## 2014-10-14 DIAGNOSIS — Z8679 Personal history of other diseases of the circulatory system: Secondary | ICD-10-CM | POA: Diagnosis not present

## 2014-10-14 LAB — COMPREHENSIVE METABOLIC PANEL
ALBUMIN: 3.8 g/dL (ref 3.5–5.2)
ALK PHOS: 49 U/L (ref 39–117)
ALT: 20 U/L (ref 0–35)
ANION GAP: 11 (ref 5–15)
AST: 19 U/L (ref 0–37)
BILIRUBIN TOTAL: 0.4 mg/dL (ref 0.3–1.2)
BUN: 14 mg/dL (ref 6–23)
CHLORIDE: 106 mmol/L (ref 96–112)
CO2: 23 mmol/L (ref 19–32)
CREATININE: 0.93 mg/dL (ref 0.50–1.10)
Calcium: 9.4 mg/dL (ref 8.4–10.5)
GFR, EST AFRICAN AMERICAN: 72 mL/min — AB (ref >90)
GFR, EST NON AFRICAN AMERICAN: 62 mL/min — AB (ref >90)
Glucose, Bld: 254 mg/dL — ABNORMAL HIGH (ref 70–99)
Potassium: 3.8 mmol/L (ref 3.5–5.1)
Sodium: 140 mmol/L (ref 135–145)
TOTAL PROTEIN: 5.5 g/dL — AB (ref 6.0–8.3)

## 2014-10-14 LAB — CBC WITH DIFFERENTIAL/PLATELET
BASOS ABS: 0 10*3/uL (ref 0.0–0.1)
Basophils Relative: 0 % (ref 0–1)
EOS ABS: 0.1 10*3/uL (ref 0.0–0.7)
EOS PCT: 1 % (ref 0–5)
HCT: 37.2 % (ref 36.0–46.0)
Hemoglobin: 12.5 g/dL (ref 12.0–15.0)
LYMPHS PCT: 43 % (ref 12–46)
Lymphs Abs: 2.5 10*3/uL (ref 0.7–4.0)
MCH: 30 pg (ref 26.0–34.0)
MCHC: 33.6 g/dL (ref 30.0–36.0)
MCV: 89.2 fL (ref 78.0–100.0)
MONOS PCT: 6 % (ref 3–12)
Monocytes Absolute: 0.3 10*3/uL (ref 0.1–1.0)
Neutro Abs: 2.9 10*3/uL (ref 1.7–7.7)
Neutrophils Relative %: 50 % (ref 43–77)
Platelets: 231 10*3/uL (ref 150–400)
RBC: 4.17 MIL/uL (ref 3.87–5.11)
RDW: 13.6 % (ref 11.5–15.5)
WBC: 5.8 10*3/uL (ref 4.0–10.5)

## 2014-10-14 NOTE — ED Notes (Signed)
C/o a sl headache 

## 2014-10-14 NOTE — ED Notes (Signed)
emt reports patient very unsteady on transfer to bed.

## 2014-10-14 NOTE — ED Notes (Signed)
The pt has had dizziness for one year.  She was diagnosed with vertigo.  She was driving today and she feels like she was leaning to the lt side.  She was sent here from an urgent care in [facility].  She reports that she is not thinking striaght

## 2014-10-15 ENCOUNTER — Emergency Department (HOSPITAL_COMMUNITY): Payer: Medicare Other

## 2014-10-15 DIAGNOSIS — R410 Disorientation, unspecified: Secondary | ICD-10-CM | POA: Diagnosis not present

## 2014-10-15 DIAGNOSIS — R42 Dizziness and giddiness: Secondary | ICD-10-CM | POA: Diagnosis not present

## 2014-10-15 LAB — PROTIME-INR
INR: 2.23 — AB (ref 0.00–1.49)
Prothrombin Time: 24.9 seconds — ABNORMAL HIGH (ref 11.6–15.2)

## 2014-10-15 MED ORDER — MECLIZINE HCL 50 MG PO TABS: 25.0000 mg | ORAL_TABLET | Freq: Three times a day (TID) | ORAL | Status: DC | PRN

## 2014-10-15 MED ORDER — SODIUM CHLORIDE 0.9 % IV BOLUS (SEPSIS)
1000.0000 mL | INTRAVENOUS | Status: AC
  Administered 2014-10-15: 1000 mL via INTRAVENOUS

## 2014-10-15 NOTE — ED Notes (Signed)
Patient gone to MRI.

## 2014-10-15 NOTE — Discharge Instructions (Signed)

## 2014-10-15 NOTE — ED Provider Notes (Signed)
CSN: [id number]     Arrival date & time 10/14/14  2042 History  This chart was scribed for Pamella Pert, MD by Delphia Grates, ED Scribe. This patient was seen in room B18C/B18C and the patient's care was started at 12:50 AM.   Chief Complaint  Patient presents with  . Dizziness    Patient is a 69 y.o. female presenting with dizziness. The history is provided by the patient. No language interpreter was used.  Dizziness Quality:  Imbalance and vertigo Severity:  Moderate Onset quality:  Gradual Timing:  Intermittent Progression:  Worsening Chronicity:  Recurrent Relieved by:  None tried Associated symptoms: hearing loss (left ear)   Associated symptoms: no chest pain, no diarrhea, no headaches, no nausea, no shortness of breath and no vomiting   Risk factors: hx of vertigo      HPI Comments: Olivia Mora is a 69 y.o. female, with history of DM, HLD, anxiety, DVT, GERD, who presents to the Emergency Department complaining of worsening dizziness (unbalanced) for the past year, that has gotten worse in the last 2-3 weeks. Patient reports history of vertigo that was diagnosed 1 year ago and reports subsequent hearing loss in the left ear. She notes intermittent, worsening episodes of dizziness since this time. She states she still feels dizzy currently and notes deep breathing exacerbates her symptoms. She also mentions the left side her head "does not feel right" and describes it as she is being "pulled to the left" when ambulating. Patient states she is uncoordinated and states she is now having difficulty driving and decided to come to the ED to be evaluated.  Patient is also complaining of worsening confusion and states she will enter a room in the house and forget her reason for coming in the room. She has not been evaluated since her diagnosis of vertigo. She denies dysuria. Patient is currently on Coumadin.     Past Medical History  Diagnosis Date  . Diabetes   .  Hyperlipidemia   . Anxiety   . GERD (gastroesophageal reflux disease)   . DVT (deep vein thrombosis) in pregnancy   . Abnormal EKG   . Mitral regurgitation     mild/moderate, echo, 10/2011   No past surgical history on file. Family History  Problem Relation Age of Onset  . Heart attack Brother 75  . CAD      Several family memebers  . Heart disease      Several family memebers   History  Substance Use Topics  . Smoking status: Never Smoker   . Smokeless tobacco: Not on file  . Alcohol Use: Not on file   OB History    No data available     Review of Systems  Constitutional: Negative for fever and chills.  HENT: Positive for hearing loss (left ear). Negative for rhinorrhea and sore throat.   Eyes: Negative for visual disturbance.  Respiratory: Negative for cough and shortness of breath.   Cardiovascular: Negative for chest pain and leg swelling.  Gastrointestinal: Negative for nausea, vomiting, abdominal pain and diarrhea.  Genitourinary: Negative for dysuria.  Musculoskeletal: Positive for gait problem. Negative for back pain and neck pain.  Skin: Negative for rash.  Neurological: Positive for dizziness. Negative for light-headedness and headaches.  Hematological: Bruises/bleeds easily (on Coumadin).  Psychiatric/Behavioral: Positive for confusion.      Allergies  Review of patient's allergies indicates no known allergies.  Home Medications   Prior to Admission medications   Medication Sig Start Date  End Date Taking? Authorizing Provider  ALPRAZolam Duanne Moron) 1 MG tablet Take 1 mg by mouth 3 (three) times daily as needed for sleep.   Yes Historical Provider, MD  clonazePAM (KLONOPIN) 1 MG tablet Take 1 mg by mouth 4 (four) times daily.   Yes Historical Provider, MD  Cyanocobalamin (VITAMIN B-12 IJ) Inject 1 mL as directed every 30 (thirty) days.    Yes Historical Provider, MD  glipiZIDE (GLUCOTROL) 10 MG tablet Take 10 mg by mouth 2 (two) times daily before a meal.    Yes Historical Provider, MD  hydrOXYzine (ATARAX/VISTARIL) 10 MG tablet Take 10 mg by mouth daily.   Yes Historical Provider, MD  insulin glargine (LANTUS) 100 UNIT/ML injection Inject 18 Units into the skin at bedtime.   Yes Historical Provider, MD  metFORMIN (GLUCOPHAGE) 1000 MG tablet Take 1,000 mg by mouth 2 (two) times daily with a meal.   Yes Historical Provider, MD  ranitidine (ZANTAC) 150 MG tablet Take 150 mg by mouth as needed for heartburn.   Yes Historical Provider, MD  simvastatin (ZOCOR) 40 MG tablet Take 40 mg by mouth every evening.   Yes Historical Provider, MD  traZODone (DESYREL) 100 MG tablet Take 100 mg by mouth at bedtime.   Yes Historical Provider, MD  warfarin (COUMADIN) 4 MG tablet Take 4 mg by mouth daily.   Yes Historical Provider, MD   Triage Vitals: BP 111/66 mmHg  Pulse 54  Temp(Src) 97.6 F (36.4 C) (Oral)  Resp 17  Ht 5\' 3"  (1.6 m)  Wt 158 lb (71.668 kg)  BMI 28.00 kg/m2  SpO2 97%  Physical Exam  Constitutional: She is oriented to person, place, and time. She appears well-developed and well-nourished. No distress.  HENT:  Head: Normocephalic and atraumatic.  Right Ear: Hearing normal.  Left Ear: Hearing normal.  Nose: Nose normal.  Mouth/Throat: Oropharynx is clear and moist and mucous membranes are normal.  Eyes: Conjunctivae and EOM are normal. Pupils are equal, round, and reactive to light.  Neck: Normal range of motion. Neck supple.  Cardiovascular: Normal rate, regular rhythm, S1 normal, S2 normal, normal heart sounds and intact distal pulses.  Exam reveals no gallop and no friction rub.   No murmur heard. Pulmonary/Chest: Effort normal and breath sounds normal. No respiratory distress. She exhibits no tenderness.  Abdominal: Soft. Normal appearance and bowel sounds are normal. There is no hepatosplenomegaly. There is no tenderness. There is no rebound, no guarding, no tenderness at McBurney's point and negative Murphy's sign. No hernia.   Musculoskeletal: Normal range of motion.  Neurological: She is alert and oriented to person, place, and time. She has normal strength. No cranial nerve deficit or sensory deficit. Coordination normal. GCS eye subscore is 4. GCS verbal subscore is 5. GCS motor subscore is 6.  alert, oriented x3 speech: normal in context and clarity memory: intact grossly cranial nerves II-XII: intact motor strength: full proximally and distally no involuntary movements or tremors sensation: intact to light touch diffusely  cerebellar: finger-to-nose and heel-to-shin intact gait: normal forwards and backwards  Skin: Skin is warm, dry and intact. No rash noted. No cyanosis.  Psychiatric: She has a normal mood and affect. Her speech is normal and behavior is normal. Thought content normal.  Nursing note and vitals reviewed.   ED Course  Procedures (including critical care time)  DIAGNOSTIC STUDIES: Oxygen Saturation is 97% on room air, adequate by my interpretation.    COORDINATION OF CARE: At 0102 Discussed treatment plan with patient  which includes MRI and  IV fluids. Patient agrees.   Labs Review Labs Reviewed  COMPREHENSIVE METABOLIC PANEL - Abnormal; Notable for the following:    Glucose, Bld 254 (*)    Total Protein 5.5 (*)    GFR calc non Af Amer 62 (*)    GFR calc Af Amer 72 (*)    All other components within normal limits  PROTIME-INR - Abnormal; Notable for the following:    Prothrombin Time 24.9 (*)    INR 2.23 (*)    All other components within normal limits  CBC WITH DIFFERENTIAL/PLATELET    Imaging Review Ct Head Wo Contrast  10/14/2014   CLINICAL DATA:  Dizziness.  EXAM: CT HEAD WITHOUT CONTRAST  TECHNIQUE: Contiguous axial images were obtained from the base of the skull through the vertex without intravenous contrast.  COMPARISON:  Head CT 12/24/2012, brain MRI 02/08/2013  FINDINGS: No intracranial hemorrhage, mass effect, or midline shift. No hydrocephalus. The basilar  cisterns are patent. No evidence of territorial infarct. No intracranial fluid collection. Mild generalized atrophy, stable. Calvarium is intact. Included paranasal sinuses and mastoid air cells are well aerated.  IMPRESSION: No acute intracranial abnormality.   Electronically Signed   By: Jeb Levering M.D.   On: 10/14/2014 22:40     EKG Interpretation   Date/Time:  Tuesday October 14 2014 21:11:24 EDT Ventricular Rate:  62 PR Interval:  150 QRS Duration: 88 QT Interval:  442 QTC Calculation: 448 R Axis:   -7 Text Interpretation:  Normal sinus rhythm Low voltage QRS Cannot rule out  Anterior infarct , age undetermined No significant change since last  tracing Confirmed by Abena Erdman  MD, Jatziry Wechter ([id number]) on 10/15/2014 6:13:40 AM      MDM   Final diagnoses:  Dizziness    6:09 AM 69 y.o. female w hx of DM, HLP, DVT on coumadin who presents as work worsening dizziness over the last few weeks. She denies any fevers, headache injuries, or falls. She is alert and oriented 3 on my exam without evidence of confusion. She states that she sometimes walks into the kitchen and forgets what she was going to do. I do not think she is grossly confused. Her vital signs are unremarkable here. She notes that she has had intermittent dizziness since a severe episode of vertigo approximately one year ago. She is also had some hearing loss in her left ear after that episode of dizziness. Screening labs and imaging thus far noncontributory. The patient's symptoms sound to be chronic but will get MRI to rule out acute infarct.  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 She continues to appear well on exam. She was ambulatory for me without difficulty.  I have discussed the diagnosis/risks/treatment options with the patient and believe the pt to be eligible for discharge home to follow-up with her pcp. I prescribed the patient meclizine but warned her to use it only as needed and not to  ambulate after taking it. We also discussed returning to the ED immediately if new or worsening sx occur. We discussed the sx which are most concerning (e.g., worsening dizziness, fever, falls ) that necessitate immediate return. Medications administered to the patient during their visit and any new prescriptions provided to the patient are listed below.  Medications given during this visit Medications  sodium chloride 0.9 % bolus 1,000 mL (0 mLs Intravenous Stopped 10/15/14 0500)    Discharge Medication List as of 10/15/2014  4:52 AM  START taking these medications   Details  meclizine (ANTIVERT) 50 MG tablet Take 0.5 tablets (25 mg total) by mouth 3 (three) times daily as needed for dizziness., Starting 10/15/2014, Until Discontinued, Print           I personally performed the services described in this documentation, which was scribed in my presence. The recorded information has been reviewed and is accurate.    Pamella Pert, MD 10/15/14 [phone]

## 2014-10-21 DIAGNOSIS — Z7901 Long term (current) use of anticoagulants: Secondary | ICD-10-CM | POA: Diagnosis not present

## 2014-11-13 ENCOUNTER — Ambulatory Visit: Payer: Self-pay | Admitting: *Deleted

## 2014-11-18 DIAGNOSIS — Z7901 Long term (current) use of anticoagulants: Secondary | ICD-10-CM | POA: Diagnosis not present

## 2014-11-27 DIAGNOSIS — F329 Major depressive disorder, single episode, unspecified: Secondary | ICD-10-CM | POA: Diagnosis not present

## 2014-11-27 DIAGNOSIS — Z8679 Personal history of other diseases of the circulatory system: Secondary | ICD-10-CM | POA: Diagnosis not present

## 2014-11-27 DIAGNOSIS — E785 Hyperlipidemia, unspecified: Secondary | ICD-10-CM | POA: Diagnosis not present

## 2014-11-27 DIAGNOSIS — E119 Type 2 diabetes mellitus without complications: Secondary | ICD-10-CM | POA: Diagnosis not present

## 2014-11-27 DIAGNOSIS — Z79899 Other long term (current) drug therapy: Secondary | ICD-10-CM | POA: Diagnosis not present

## 2014-12-02 ENCOUNTER — Other Ambulatory Visit: Payer: Self-pay | Admitting: Obstetrics & Gynecology

## 2014-12-02 ENCOUNTER — Other Ambulatory Visit (HOSPITAL_COMMUNITY)
Admission: RE | Admit: 2014-12-02 | Discharge: 2014-12-02 | Disposition: A | Discharge status: Home / Self Care | Payer: Medicare Other | Source: Ambulatory Visit | Attending: Obstetrics & Gynecology | Admitting: Obstetrics & Gynecology

## 2014-12-02 ENCOUNTER — Ambulatory Visit (INDEPENDENT_AMBULATORY_CARE_PROVIDER_SITE_OTHER): Payer: Medicare Other | Admitting: Obstetrics & Gynecology

## 2014-12-02 ENCOUNTER — Encounter: Payer: Self-pay | Admitting: Obstetrics & Gynecology

## 2014-12-02 VITALS — BP 120/70 | HR 80 | Ht 63.0 in | Wt 158.0 lb

## 2014-12-02 DIAGNOSIS — N6452 Nipple discharge: Secondary | ICD-10-CM

## 2014-12-02 NOTE — Addendum Note (Signed)
Addended by: Farley Ly on: 12/02/2014 01:41 PM   Modules accepted: Orders

## 2014-12-02 NOTE — Progress Notes (Signed)
Patient ID: Olivia Mora, female   DOB: 02-22-1946, 69 y.o.   MRN: [id number]  Chief Complaint  Patient presents with  . gyn vist    blood coming out rt nipple x 2 week.c/c want something sinus.    2 weeks right bloody nipple discharge  No pain  No masses  Never had before  Mammogram last July was negative  Exam Right breast without skin changes No masses on exam Easy to express bloody breast discharge  evalauted under microscope and found to be ellular debris and red blood cells, no fat  Sent to cytopathology for evaluation Discussed with dr Owens Shark who recommended diagnostic right mammography followed by ductogram most probably, that is ordered today

## 2014-12-02 NOTE — Addendum Note (Signed)
Addended by: Farley Ly on: 12/02/2014 01:33 PM   Modules accepted: Orders

## 2014-12-04 DIAGNOSIS — Z86711 Personal history of pulmonary embolism: Secondary | ICD-10-CM | POA: Diagnosis not present

## 2014-12-04 DIAGNOSIS — Z7901 Long term (current) use of anticoagulants: Secondary | ICD-10-CM | POA: Diagnosis not present

## 2014-12-04 DIAGNOSIS — E785 Hyperlipidemia, unspecified: Secondary | ICD-10-CM | POA: Diagnosis not present

## 2014-12-04 DIAGNOSIS — E119 Type 2 diabetes mellitus without complications: Secondary | ICD-10-CM | POA: Diagnosis not present

## 2014-12-08 ENCOUNTER — Ambulatory Visit
Admission: RE | Admit: 2014-12-08 | Discharge: 2014-12-08 | Disposition: A | Discharge status: Home / Self Care | Payer: Medicare Other | Source: Ambulatory Visit | Attending: Obstetrics & Gynecology | Admitting: Obstetrics & Gynecology

## 2014-12-08 ENCOUNTER — Other Ambulatory Visit: Payer: Self-pay | Admitting: Obstetrics & Gynecology

## 2014-12-08 DIAGNOSIS — N6452 Nipple discharge: Secondary | ICD-10-CM | POA: Diagnosis not present

## 2014-12-11 DIAGNOSIS — Z7901 Long term (current) use of anticoagulants: Secondary | ICD-10-CM | POA: Diagnosis not present

## 2014-12-19 DIAGNOSIS — Z7901 Long term (current) use of anticoagulants: Secondary | ICD-10-CM | POA: Diagnosis not present

## 2015-01-08 ENCOUNTER — Ambulatory Visit
Admission: RE | Admit: 2015-01-08 | Discharge: 2015-01-08 | Disposition: A | Discharge status: Home / Self Care | Payer: Medicare Other | Source: Ambulatory Visit | Attending: Obstetrics & Gynecology | Admitting: Obstetrics & Gynecology

## 2015-01-08 DIAGNOSIS — N6452 Nipple discharge: Secondary | ICD-10-CM

## 2015-01-08 DIAGNOSIS — Z7901 Long term (current) use of anticoagulants: Secondary | ICD-10-CM | POA: Diagnosis not present

## 2015-01-22 ENCOUNTER — Telehealth: Payer: Self-pay | Admitting: *Deleted

## 2015-01-22 NOTE — Telephone Encounter (Signed)
Based on the note from her ductogram she already has an appt on July 11 with breast surgeon, they set that up So as far as i know she has an appt  I called the patient and relayed as much

## 2015-01-22 NOTE — Telephone Encounter (Signed)
Pt states did she need to have any follow up with bloody discharge from breast nipple. Pt states she had mammogram but is questioning whether or not need to be referred to surgeon.

## 2015-01-23 DIAGNOSIS — Z7901 Long term (current) use of anticoagulants: Secondary | ICD-10-CM | POA: Diagnosis not present

## 2015-02-10 DIAGNOSIS — Z7901 Long term (current) use of anticoagulants: Secondary | ICD-10-CM | POA: Diagnosis not present

## 2015-02-13 ENCOUNTER — Other Ambulatory Visit: Payer: Self-pay | Admitting: Surgery

## 2015-02-13 DIAGNOSIS — N6452 Nipple discharge: Secondary | ICD-10-CM | POA: Diagnosis not present

## 2015-02-23 ENCOUNTER — Ambulatory Visit: Payer: Medicare Other

## 2015-02-23 ENCOUNTER — Other Ambulatory Visit: Payer: Self-pay

## 2015-02-23 DIAGNOSIS — Z1231 Encounter for screening mammogram for malignant neoplasm of breast: Secondary | ICD-10-CM

## 2015-02-24 ENCOUNTER — Other Ambulatory Visit: Payer: Medicare Other

## 2015-03-06 ENCOUNTER — Ambulatory Visit
Admission: RE | Admit: 2015-03-06 | Discharge: 2015-03-06 | Disposition: A | Discharge status: Home / Self Care | Payer: Medicare Other | Source: Ambulatory Visit | Attending: Surgery | Admitting: Surgery

## 2015-03-06 ENCOUNTER — Ambulatory Visit
Admission: RE | Admit: 2015-03-06 | Discharge: 2015-03-06 | Disposition: A | Discharge status: Home / Self Care | Payer: Medicare Other | Source: Ambulatory Visit

## 2015-03-06 DIAGNOSIS — N63 Unspecified lump in breast: Secondary | ICD-10-CM | POA: Diagnosis not present

## 2015-03-06 DIAGNOSIS — N6041 Mammary duct ectasia of right breast: Secondary | ICD-10-CM | POA: Diagnosis not present

## 2015-03-06 DIAGNOSIS — Z1231 Encounter for screening mammogram for malignant neoplasm of breast: Secondary | ICD-10-CM | POA: Diagnosis not present

## 2015-03-06 MED ORDER — GADOBENATE DIMEGLUMINE 529 MG/ML IV SOLN: 14.0000 mL | Freq: Once | INTRAVENOUS | Status: DC | PRN

## 2015-03-09 ENCOUNTER — Other Ambulatory Visit: Payer: Self-pay | Admitting: Surgery

## 2015-03-09 DIAGNOSIS — N63 Unspecified lump in unspecified breast: Secondary | ICD-10-CM

## 2015-03-10 DIAGNOSIS — Z7901 Long term (current) use of anticoagulants: Secondary | ICD-10-CM | POA: Diagnosis not present

## 2015-03-10 DIAGNOSIS — E119 Type 2 diabetes mellitus without complications: Secondary | ICD-10-CM | POA: Diagnosis not present

## 2015-03-12 ENCOUNTER — Other Ambulatory Visit: Payer: Self-pay | Admitting: Surgery

## 2015-03-12 DIAGNOSIS — N63 Unspecified lump in unspecified breast: Secondary | ICD-10-CM

## 2015-03-12 DIAGNOSIS — N6452 Nipple discharge: Secondary | ICD-10-CM

## 2015-03-17 ENCOUNTER — Ambulatory Visit
Admission: RE | Admit: 2015-03-17 | Discharge: 2015-03-17 | Disposition: A | Discharge status: Home / Self Care | Payer: Medicare Other | Source: Ambulatory Visit | Attending: Surgery | Admitting: Surgery

## 2015-03-17 DIAGNOSIS — N6489 Other specified disorders of breast: Secondary | ICD-10-CM | POA: Diagnosis not present

## 2015-03-26 ENCOUNTER — Other Ambulatory Visit: Payer: Self-pay | Admitting: Surgery

## 2015-03-26 DIAGNOSIS — R928 Other abnormal and inconclusive findings on diagnostic imaging of breast: Secondary | ICD-10-CM

## 2015-03-27 ENCOUNTER — Telehealth: Payer: Self-pay

## 2015-03-27 NOTE — Telephone Encounter (Signed)
Spoke with the patient regarding stopping Coumadin for her MR breast biopsy scheduled on 04-02-15.  Dr. [name] advised that the patient may stop Coumadin for 4 days prior to biopsy. The patient was instructed to take her last Coumadin on Saturday 03-28-15.  The patient was informed the Radiologist doing the MR biopsy would instruct her on when to re-start Coumadin.  The patient verbalized an understanding of these instructions.

## 2015-03-30 ENCOUNTER — Other Ambulatory Visit: Payer: Self-pay | Admitting: Surgery

## 2015-03-30 DIAGNOSIS — N63 Unspecified lump in unspecified breast: Secondary | ICD-10-CM

## 2015-03-31 ENCOUNTER — Other Ambulatory Visit: Payer: Self-pay | Admitting: Surgery

## 2015-03-31 DIAGNOSIS — N63 Unspecified lump in unspecified breast: Secondary | ICD-10-CM

## 2015-04-01 ENCOUNTER — Other Ambulatory Visit: Payer: Self-pay | Admitting: Surgery

## 2015-04-01 DIAGNOSIS — R928 Other abnormal and inconclusive findings on diagnostic imaging of breast: Secondary | ICD-10-CM

## 2015-04-02 ENCOUNTER — Ambulatory Visit
Admission: RE | Admit: 2015-04-02 | Discharge: 2015-04-02 | Disposition: A | Discharge status: Home / Self Care | Payer: Medicare Other | Source: Ambulatory Visit | Attending: Surgery | Admitting: Surgery

## 2015-04-02 DIAGNOSIS — R928 Other abnormal and inconclusive findings on diagnostic imaging of breast: Secondary | ICD-10-CM

## 2015-04-02 DIAGNOSIS — C50511 Malignant neoplasm of lower-outer quadrant of right female breast: Secondary | ICD-10-CM | POA: Diagnosis not present

## 2015-04-02 DIAGNOSIS — D242 Benign neoplasm of left breast: Secondary | ICD-10-CM | POA: Diagnosis not present

## 2015-04-02 DIAGNOSIS — N6489 Other specified disorders of breast: Secondary | ICD-10-CM | POA: Diagnosis not present

## 2015-04-02 DIAGNOSIS — N63 Unspecified lump in breast: Secondary | ICD-10-CM | POA: Diagnosis not present

## 2015-04-02 MED ORDER — GADOBENATE DIMEGLUMINE 529 MG/ML IV SOLN
14.0000 mL | Freq: Once | INTRAVENOUS | Status: AC | PRN
  Administered 2015-04-02: 14 mL via INTRAVENOUS

## 2015-04-09 ENCOUNTER — Telehealth: Payer: Self-pay | Admitting: *Deleted

## 2015-04-09 DIAGNOSIS — C50911 Malignant neoplasm of unspecified site of right female breast: Secondary | ICD-10-CM | POA: Diagnosis not present

## 2015-04-09 DIAGNOSIS — D242 Benign neoplasm of left breast: Secondary | ICD-10-CM | POA: Diagnosis not present

## 2015-04-09 NOTE — Telephone Encounter (Signed)
Received call from Ammie at Dr. Pollie Friar office stating they have a pt in the office that they would like an urgent appt for.  I looked up pt and noticed that she lived in [place] and asked Ammie did she not want to go to [facility] to be seen.  She said not for Med Onc.  Gave her an appt to see Dr. [name] on 04/13/15 at 3:30 for arrival and 3:45 for MD.  She is to give appt information, directions, instructions and my number to the pt.  I could not mail out a packet - so I placed a note for one to be given at time of check in.  Ammie faxed records and I placed a copy in Dr. Geralyn Flash box and took one to HIM to scan.

## 2015-04-13 ENCOUNTER — Ambulatory Visit: Payer: Medicare Other | Admitting: Hematology and Oncology

## 2015-04-20 NOTE — Progress Notes (Signed)
Location of Breast Cancer:Grade I invasive ductal carcinoma.  Histology per Pathology Report:04/02/15 INAL DIAGNOSIS Diagnosis 1. Breast, right, needle core biopsy, 8:00 o'clock - INVASIVE DUCTAL CARCINOMA WITH EXTRACELLULAR MUCIN. 2. 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 - FIBROCYSTIC CHANGES WITH USUAL DUCTAL HYPERPLASIA. - NO MALIGNANCY IDENTIFIED.  Receptor Status: ER(+), PR (+), Her2-neu (-)  Did patient present with symptoms (if so, please note symptoms) or was this found on screening mammography?:presented with nipple discharge 12/02/14 negative for malignant cells.delay in follow up when discharge stopped.MRI on 02/13/15 .  Past/Anticipated interventions by surgeon, if FMM:CRFVO biopsy and lumpectomy.  Past/Anticipated interventions by medical oncology, if any: Chemotherapy:Consultation with Dr.Gudena 04/22/15.  Lymphedema issues, if any:  Pain issues, if any: right breast soreness  SAFETY ISSUES:  Prior radiation? No  Pacemaker/ICD?No  Possible current pregnancy?No  Is the patient on methotrexate? No  Current Complaints / other details:Married. chidren x 2.Menarche age 66.first born child at age 19.LMP age 16.No hormone replacement therapy. No family history of breast or female cancers. Brain mri negative for mets.10/15/14 Small cerebellar infarcts. BP 124/57 mmHg  Pulse 55  Temp(Src) 98.2 F (36.8 C)  Wt 160 lb 11.2 oz (72.893 kg)  SpO2 98%    Ashtan Laton, Kathyrn Drown, RN 04/20/2015,4:25 PM  [facility] [place].

## 2015-04-22 ENCOUNTER — Other Ambulatory Visit: Payer: Self-pay | Admitting: Surgery

## 2015-04-22 ENCOUNTER — Ambulatory Visit
Admission: RE | Admit: 2015-04-22 | Discharge: 2015-04-22 | Disposition: A | Discharge status: Home / Self Care | Payer: Medicare Other | Source: Ambulatory Visit | Attending: Radiation Oncology | Admitting: Radiation Oncology

## 2015-04-22 ENCOUNTER — Encounter: Payer: Self-pay | Admitting: *Deleted

## 2015-04-22 ENCOUNTER — Encounter: Payer: Self-pay | Admitting: Hematology and Oncology

## 2015-04-22 ENCOUNTER — Encounter: Payer: Self-pay | Admitting: Radiation Oncology

## 2015-04-22 ENCOUNTER — Ambulatory Visit (HOSPITAL_BASED_OUTPATIENT_CLINIC_OR_DEPARTMENT_OTHER): Payer: Medicare Other | Admitting: Hematology and Oncology

## 2015-04-22 VITALS — BP 124/57 | HR 55 | Temp 98.2°F | Resp 18 | Ht 63.0 in | Wt 160.8 lb

## 2015-04-22 VITALS — BP 124/57 | HR 55 | Temp 98.2°F | Wt 160.7 lb

## 2015-04-22 DIAGNOSIS — D242 Benign neoplasm of left breast: Secondary | ICD-10-CM

## 2015-04-22 DIAGNOSIS — C50511 Malignant neoplasm of lower-outer quadrant of right female breast: Secondary | ICD-10-CM

## 2015-04-22 DIAGNOSIS — C779 Secondary and unspecified malignant neoplasm of lymph node, unspecified: Principal | ICD-10-CM

## 2015-04-22 DIAGNOSIS — N6092 Unspecified benign mammary dysplasia of left breast: Secondary | ICD-10-CM

## 2015-04-22 DIAGNOSIS — C50911 Malignant neoplasm of unspecified site of right female breast: Secondary | ICD-10-CM

## 2015-04-22 NOTE — Progress Notes (Signed)
Please see the Nurse Progress Note in the MD Initial Consult Encounter for this patient. 

## 2015-04-22 NOTE — Assessment & Plan Note (Addendum)
Right breast biopsy 04/02/2015: 8:00: IDC with extracellular mucin, ER 100%, PR 50%, Ki-67 20%, grade 1 HER-2 negative ratio 1.11, T1b N0 stage I a clinical stage; left breast biopsy 4:00: Intraductal papilloma with UDH and fibrocystic changes.  MRI breasts 03/06/2015:Right breast 8:00 position: 6 mm enhancing nodule with associated dilated duct and with blood; left breast 9:00 middle depth: 4 mm nodule, 4:00: 1 cm nodule nonspecific appearance.  Pathology and radiology counseling:Discussed with the patient, the details of pathology including the type of breast cancer,the clinical staging, the significance of ER, PR and HER-2/neu receptors and the implications for treatment. After reviewing the pathology in detail, we proceeded to discuss the different treatment options between surgery, radiation, chemotherapy, antiestrogen therapies.  Recommendations: 1. Breast conserving surgery followed by 2. Oncotype DX testing to determine if chemotherapy would be of any benefit followed by 3. Adjuvant radiation therapy followed by 4. Adjuvant antiestrogen therapy with aromatase inhibitors  Oncotype counseling: I discussed Oncotype DX test. I explained to the patient that this is a 21 gene panel to evaluate patient tumors DNA to calculate recurrence score. This would help determine whether patient has high risk or intermediate risk or low risk breast cancer. She understands that if her tumor was found to be high risk, she would benefit from systemic chemotherapy. If low risk, no need of chemotherapy. If she was found to be intermediate risk, we would need to evaluate the score as well as other risk factors and determine if an abbreviated chemotherapy may be of benefit.  Return to clinic after surgery to discuss final pathology report and then determine if Oncotype DX testing will need to be sent.

## 2015-04-22 NOTE — Progress Notes (Signed)
Discussed plan of care with pt. Informed we will get her in to see Dr. Whitney Muse for f/u after surgery. Pt has contact information.

## 2015-04-22 NOTE — Progress Notes (Signed)
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 female  Who presents with invasive ductal carcinoma of the right breast. She originally complained of bloody nipple discharge. She underwent a mammogram in August which came back as normal. A subsequent MRI of the breasts showed bilateral breast masses. There was a 6 x 5 x 4 mm mass in the right breast. Biopsy confirmed invasive ductal carcinoma with DCIS; ER/PR positive, HER-2 negative, with a Ki-67 of 20%. A 1 cm mass of the left breast was biopsied and was found to be an intraductal papilloma. Also in the left breast is a 4 mm lesion. The radiologist recommended a 6 month follow-up for this lesion. She met with Dr. Gudena earlier this morning. She has met with Dr. Newman and bilateral lumpectomies are planned. She is accompanied by her son. She is the primary caregiver for her husband who has had multiple strokes and lives in [place], [place]  PREVIOUS RADIATION THERAPY: No  Past medical, social and family history were reviewed in the electronic chart. Review of symptoms was reviewed in the electronic chart. Medications were reviewed in the electronic chart.   PHYSICAL EXAM:  Filed Vitals:   04/22/15 1314  BP: 124/57  Pulse: 55  Temp: 98.2 F (36.8 C)  .160 lb 11.2 oz (72.893 kg). Pleasant woman who appears her stated age. Alert and oriented x3. Flat affect. Guards significantly, therefore a physical exam was difficult to perform. Bruising along the lower outer quadrant of the left breast with no palpable masses. No palpable masses of the right breast. No palpable cervical, supraclavicular, or axillary adenopathy.  IMPRESSION: Stage I Invasive  Ductal Carcinoma of the Right Breast  PLAN: I spoke to the patient today regarding her diagnosis and options for treatment. We discussed the equivalence in terms of survival and local failure between mastectomy and breast conservation. We discussed the role of radiation in decreasing local failures in patients who undergo lumpectomy. We discussed the process of simulation and the placement tattoos. We discussed the possibility of asymptomatic lung damage. We discussed the low likelihood of secondary malignancies. We discussed the possible side effects including but not limited to skin redness, fatigue, permanent skin darkening, and breast swelling. We discussed the process of simulation and the placement of tattoos. I will see her back after her Oncotype score. I did clarify with her that if she needed chemotherapy this would be performed prior to radiation.   The patient would prefer to have her treatments at [facility] since she lives in [place].  Val Malloy, RN contacted [facility] to discern the time of the patient's lumpectomy. I've given the pt the number of [facility] to contact them herself. The patient will be referred to Dr. Penland on Tuesday at [facility]. She will then be referred back to Dr. Penland after surgery who will go over her Oncotype results. As soon as she has been cleared by surgery, and it has been determined she does not need chemotherapy, she can be scheduled for CT simulation at [place]. I have given her the number of [facility] to facilitate that as well. She will have 16 treatments as an outpatient.  I   spent 60 minutes  face to face with the patient and more than 50% of that time was spent in counseling and/or coordination of care.   This document serves as a record of services personally performed by Thea Silversmith, MD. It was created on her behalf by Darcus Austin, a trained medical scribe. The creation of this record is based on the  scribe's personal observations and the provider's statements to them. This document has been checked and approved by the attending provider.   ------------------------------------------------  Thea Silversmith, MD

## 2015-04-22 NOTE — Progress Notes (Signed)
[place] NOTE  Patient Care Team: [name], MD as PCP - General (Internal Medicine)  CHIEF COMPLAINTS/PURPOSE OF CONSULTATION:  Newly diagnosed breast cancer  HISTORY OF PRESENTING ILLNESS:  Olivia Mora 69 y.o. female is here because of recent diagnosis of Right breast invasive ductal carcinoma. She originally presented with complaints of nipple discharge. She underwent a mammogram on August 2016 which was normal. Subsequently she underwent an MRI of the breast which showed bilateral breast masses. The right breast 8:00 position there was a 6 x 5 x 4 mm mass which on biopsy came back as invasive ductal carcinoma with DCIS ER/PR positive HER-2 negative with a Ki-67 of 20%. The left breast at 4:00 position there was a 1 cm mass which was biopsy-proven to be intraductal papilloma. At 9:00 position there was a 4 mm lesion which the radiologist recommended a 6 month follow-up.  Patient complains of bruising related recent biopsies.  I reviewed her records extensively and collaborated the history with the patient.  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  She was exposed to hormone replacement therapy for 20 years  She has no family history of Breast/GYN/GI  cancer  MEDICAL HISTORY:  Past Medical History  Diagnosis Date  . Diabetes ([place])   . Hyperlipidemia   . Anxiety   . GERD (gastroesophageal reflux disease)   . DVT (deep vein thrombosis) in pregnancy   . Abnormal EKG   . Mitral regurgitation     mild/moderate, echo, 10/2011    SURGICAL HISTORY: Past Surgical History  Procedure Laterality Date  . Tubal ligation    . Appendectomy    . Other surgical history      jaw    SOCIAL HISTORY: Social History   Social History  . Marital Status: Married    Spouse Name: N/A  . Number of Children: N/A  . Years of Education: N/A   Occupational History  . Not on file.   Social History Main Topics  . Smoking status: Never Smoker   . Smokeless tobacco: Not on file  . Alcohol Use: Not on file  . Drug Use: No  . Sexual Activity: Not on file   Other Topics Concern  . Not on file   Social History Narrative    FAMILY HISTORY: Family History  Problem Relation Age of Onset  . Heart attack Brother 42  . CAD      Several family memebers  . Heart disease      Several family memebers    ALLERGIES:  has No Known Allergies.  MEDICATIONS:  Current Outpatient Prescriptions  Medication Sig Dispense Refill  . ALPRAZolam (XANAX) 1 MG tablet Take 1 mg by mouth 3 (three) times daily as needed for  sleep.    . clonazePAM (KLONOPIN) 1 MG tablet Take 1 mg by mouth 4 (four) times daily.    . cyanocobalamin (,VITAMIN B-12,) 1000 MCG/ML injection INJECT EVERY 4 WEEKS AS DIRECTED  6  . Cyanocobalamin (VITAMIN B-12 IJ) Inject 1 mL as directed every 30 (thirty) days.     Marland Kitchen glipiZIDE (GLUCOTROL) 10 MG tablet Take 10 mg by mouth 2 (two) times daily before a meal.    . hydrOXYzine (ATARAX/VISTARIL) 10 MG tablet Take 10 mg by mouth daily.    . insulin glargine (LANTUS) 100 UNIT/ML injection Inject 18 Units into the skin at bedtime.    . meclizine (ANTIVERT) 50 MG tablet Take 0.5 tablets (25 mg total) by mouth 3 (three) times daily as needed for  dizziness. 30 tablet 0  . metFORMIN (GLUCOPHAGE) 1000 MG tablet Take 1,000 mg by mouth 2 (two) times daily with a meal.    . PARoxetine (PAXIL) 20 MG tablet Take 20 mg by mouth daily.    . ranitidine (ZANTAC) 150 MG tablet Take 150 mg by mouth as needed for heartburn.    . sertraline (ZOLOFT) 50 MG tablet Take 50 mg by mouth daily.    . simvastatin (ZOCOR) 40 MG tablet Take 40 mg by mouth every evening.    . traZODone (DESYREL) 100 MG tablet Take 100 mg by mouth at bedtime.     No current facility-administered medications for this visit.    REVIEW OF SYSTEMS:   Constitutional: Denies fevers, chills or abnormal night sweats Eyes: Denies blurriness of vision, double vision or watery eyes Ears, nose, mouth, throat, and face: Denies mucositis or sore throat Respiratory: Denies cough, dyspnea or wheezes Cardiovascular: Denies palpitation, chest discomfort or lower extremity swelling Gastrointestinal:  Denies nausea, heartburn or change in bowel habits Skin: Denies abnormal skin rashes Lymphatics: Denies new lymphadenopathy or easy bruising Neurological:Denies numbness, tingling or new weaknesses Behavioral/Psych: Mood is stable, no new changes  Breast: bloody nipple discharge All other systems were reviewed with the patient and are negative.  PHYSICAL EXAMINATION: ECOG PERFORMANCE STATUS: 1 - Symptomatic but completely ambulatory  Filed Vitals:   04/22/15 1035  BP: 124/57  Pulse: 55  Temp: 98.2 F (36.8 C)  Resp: 18   Filed Weights   04/22/15 1035  Weight: 160 lb 12.8 oz (72.938 kg)    GENERAL:alert, no distress and comfortable SKIN: skin color, texture, turgor are normal, no rashes or significant lesions EYES: normal, conjunctiva are pink and non-injected, sclera clear OROPHARYNX:no exudate, no erythema and lips, buccal mucosa, and tongue normal  NECK: supple, thyroid normal size, non-tender, without nodularity LYMPH:  no palpable lymphadenopathy in the cervical, axillary or  inguinal LUNGS: clear to auscultation and percussion with normal breathing effort HEART: regular rate & rhythm and no murmurs and no lower extremity edema ABDOMEN:abdomen soft, non-tender and normal bowel sounds Musculoskeletal:no cyanosis of digits and no clubbing  PSYCH: alert & oriented x 3 with fluent speech NEURO: no focal motor/sensory deficits BREAST:bilateral bruising from recent biopsies(exam performed in the presence of a chaperone)   LABORATORY DATA:  I have reviewed the data as listed Lab Results  Component Value Date   WBC 5.8 10/14/2014   HGB 12.5 10/14/2014   HCT 37.2 10/14/2014   MCV 89.2 10/14/2014   PLT 231 10/14/2014   Lab Results  Component Value Date   NA 140 10/14/2014   K 3.8 10/14/2014   CL 106 10/14/2014   CO2 23 10/14/2014    RADIOGRAPHIC STUDIES:  I have personally reviewed the radiological reports and agreed with the findings in the report.  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  MRI breasts 03/06/2015:Right breast 8:00 position: 6 mm enhancing nodule with associated dilated duct and with blood; left breast 9:00 middle depth: 4 mm nodule, 4:00: 1 cm nodule nonspecific appearance.  Pathology and radiology counseling:Discussed with the patient, the details of pathology including the type of breast cancer,the clinical staging, the significance of ER, PR and HER-2/neu receptors and the implications for treatment. After reviewing the pathology in detail, we proceeded to discuss the different treatment options between surgery, radiation, chemotherapy, antiestrogen therapies.  Recommendations: 1. Breast conserving surgery followed by 2. Oncotype DX testing based on  the size of final pathology 3. Adjuvant radiation therapy followed by 4. Adjuvant antiestrogen therapy with aromatase inhibitors  Oncotype counseling: I discussed Oncotype DX test. I explained to the patient that this is a 21 gene panel to evaluate patient tumors DNA to calculate recurrence score. This would help determine whether patient has high risk or intermediate risk or low risk breast cancer. She understands that if her tumor was found to be high risk, she would benefit from systemic chemotherapy. If low risk, no need of chemotherapy. If she was found to be intermediate risk, we would need to evaluate the score as well as other risk factors and determine if an abbreviated chemotherapy may be of benefit.  Return to clinic to see Dr.Penland after surgery to discuss final pathology report and then determine if Oncotype DX testing will need to be sent. I suspect that it will show Low risk disease based on low grade and low ki 67. Her husband had multiple strokes and requires help from her.  All questions were answered. The patient knows to call the clinic with any problems, questions or concerns.    Rulon Eisenmenger, MD 10:54 AM

## 2015-04-22 NOTE — Addendum Note (Signed)
Addended by: Prentiss Bells on: 04/22/2015 02:03 PM   Modules accepted: Orders, Medications

## 2015-04-22 NOTE — Progress Notes (Signed)
MD note created during office visit sent to scan.  Copy to patient.   New patient intake form sent to scan.  Chart updated.  MD reviewed.

## 2015-04-23 DIAGNOSIS — Z86711 Personal history of pulmonary embolism: Secondary | ICD-10-CM | POA: Diagnosis not present

## 2015-04-23 DIAGNOSIS — Z23 Encounter for immunization: Secondary | ICD-10-CM | POA: Diagnosis not present

## 2015-04-23 DIAGNOSIS — Z853 Personal history of malignant neoplasm of breast: Secondary | ICD-10-CM | POA: Diagnosis not present

## 2015-04-23 DIAGNOSIS — Z6828 Body mass index (BMI) 28.0-28.9, adult: Secondary | ICD-10-CM | POA: Diagnosis not present

## 2015-04-23 DIAGNOSIS — E119 Type 2 diabetes mellitus without complications: Secondary | ICD-10-CM | POA: Diagnosis not present

## 2015-04-27 ENCOUNTER — Other Ambulatory Visit: Payer: Self-pay | Admitting: Surgery

## 2015-04-27 DIAGNOSIS — D242 Benign neoplasm of left breast: Secondary | ICD-10-CM

## 2015-04-28 ENCOUNTER — Encounter (HOSPITAL_COMMUNITY): Payer: Medicare Other | Attending: Hematology & Oncology | Admitting: Hematology & Oncology

## 2015-04-28 VITALS — BP 114/65 | HR 56 | Temp 98.1°F | Resp 20 | Ht 64.0 in | Wt 163.6 lb

## 2015-04-28 DIAGNOSIS — C50919 Malignant neoplasm of unspecified site of unspecified female breast: Secondary | ICD-10-CM | POA: Diagnosis not present

## 2015-04-28 DIAGNOSIS — Z170 Estrogen receptor positive status [ER+]: Secondary | ICD-10-CM

## 2015-04-28 DIAGNOSIS — D242 Benign neoplasm of left breast: Secondary | ICD-10-CM

## 2015-04-28 DIAGNOSIS — N6092 Unspecified benign mammary dysplasia of left breast: Secondary | ICD-10-CM

## 2015-04-28 NOTE — Patient Instructions (Signed)
[place] at [facility] Discharge Instructions  RECOMMENDATIONS MADE BY THE CONSULTANT AND ANY TEST RESULTS WILL BE SENT TO YOUR REFERRING PHYSICIAN.  Exam and discussion by Dr. Whitney Muse Surgery as scheduled We will see you back about 2 1/2 weeks after surgery to discuss treatment options.  Thank you for choosing [place] at [facility] to provide your oncology and hematology care.  To afford each patient quality time with our provider, please arrive at least 15 minutes before your scheduled appointment time.    You need to re-schedule your appointment should you arrive 10 or more minutes late.  We strive to give you quality time with our providers, and arriving late affects you and other patients whose appointments are after yours.  Also, if you no show three or more times for appointments you may be dismissed from the clinic at the providers discretion.     Again, thank you for choosing [facility].  Our hope is that these requests will decrease the amount of time that you wait before being seen by our physicians.       _____________________________________________________________  Should you have questions after your visit to [facility], please contact our office at [phone] between the hours of 8:30 a.m. and 4:30 p.m.  Voicemails left after 4:30 p.m. will not be returned until the following business day.  For prescription refill requests, have your pharmacy contact our office.

## 2015-04-28 NOTE — Progress Notes (Signed)
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 female is here because of recent diagnosis of Right breast invasive ductal carcinoma. She originally presented with complaints of nipple discharge. Her last mammogram prior to this presentation was in July of last year.   She saw Dr. Elonda Husky in consultation. She underwent a mammogram in May of this year and a ductogram was recommended. The ductogram was attempted on 01/08/2015 but was unable to be performed. Consultation with a breast surgeon was recommended.   She saw Dr. [name] and a breast MRI was ordered. In the right breast at 8:00 there was an enhancing nodule with associated dilated duct filled with sanguinous material. In the left breast at the 9:00 position there was a progressively enhancing nodule, and at the 4:00 position enhancement with a nonspecific appearance.   Right breast biopsy 04/02/2015: 8:00: IDC with extracellular mucin, ER 100%, PR 50%, Ki-67 20%, grade 1 HER-2 negative ratio 1.11, T1b N0 stage I a clinical stage; left breast biopsy 4:00: Intraductal papilloma with UDH and fibrocystic changes.  She has met with Dr. [name] of radiation oncology. She would prefer radiation in [place]. She has discussed bilateral lumpectomies (breast conservation) with Dr. [name]. Surgical date is set for 05/13/2015.   Patient complains of bruising related recent biopsies.  I reviewed her records extensively and collaborated the history with the patient.    The patient is nervous today.  She states that she gets her mammograms regularly.  She was due for one when she experienced her discharge and received it sooner than her original scheduled date.  She will have lumpectomies completed  on both breasts on 05/13/15 followed by radiation.  She will be accompanied by her son.  She states that she is concerned about her sugar dropping during surgery due to the fact that she is diabetic and unable to eat 12 hours before surgery.  Dr. Willey Blade is her primary.  She is up-to-date on her colonoscopy screening.  She questions why she has breast cancer and if she is suffering from something that she has previously done.  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  She is aware that there is a possibility she may need chemotherapy. And states "if it is necessary I will do what I have to." She has some familiarity with endocrine therapy.  SUMMARY OF ONCOLOGIC HISTORY:   Breast cancer of lower-outer quadrant of right female breast ([place])   03/02/2015 Initial Diagnosis Right breast biopsy 8:00: IDC with extracellular mucin, ER 100%, PR 50%, Ki-67 20%, grade 1 HER-2 negative ratio 1.11; left breast biopsy 4:00: Intraductal papilloma with UDH and fibrocystic changes   03/06/2015 Breast MRI Right breast 8:00 position: 6 mm enhancing nodule with associated dilated duct and with blood; left breast 9:00 middle depth: 4 mm nodule, 4:00: 1 cm nodule nonspecific appearance    In terms of breast cancer risk profile:  She menarched at early age of 24 and went to menopause at age 15  She had 2 pregnancy, her first child was born at age 70  She has received birth control pills for approximately 9 years.  She was exposed to hormone replacement therapy for 20 years  She has no family history of Breast/GYN/GI cancer  MEDICAL HISTORY:  Past Medical History  Diagnosis Date  . Diabetes ([place])   . Hyperlipidemia   . Anxiety   . GERD (gastroesophageal reflux disease)   . DVT (deep vein thrombosis) in pregnancy   . Abnormal EKG   . Mitral regurgitation     mild/moderate, echo, 10/2011  . Depression   . Vertigo     SURGICAL HISTORY: Past Surgical History  Procedure Laterality  Date  . Tubal ligation    . Appendectomy    . Other surgical history      jaw  . Other surgical history      cyst removal jaw    SOCIAL HISTORY: Social History   Social History  . Marital Status: Married    Spouse Name: N/A  . Number of Children: 2  . Years of Education: N/A   Occupational History  . Not on file.   Social History Main Topics  . Smoking status: Never Smoker   . Smokeless tobacco: Never Used  . Alcohol Use: No  . Drug Use: No  . Sexual Activity: Yes    Birth Control/ Protection: Post-menopausal   Other Topics Concern  . 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  He also has a cardiac condition  2 children, 5 grandchildren Never smoked EtOH, none Previously employed for a [employer]  FAMILY HISTORY: Family History  Problem Relation Age of Onset  . Heart attack Brother 44  . CAD      Several family memebers  . Heart disease      Several family memebers    ALLERGIES:  has No Known Allergies.  MEDICATIONS:  Current Outpatient Prescriptions  Medication Sig Dispense Refill  . ALPRAZolam (XANAX) 1 MG tablet Take 1 mg by mouth 3 (three) times daily as needed for sleep.    . clonazePAM (KLONOPIN) 1 MG tablet Take 1 mg by mouth 4 (four) times daily.    . cyanocobalamin (,VITAMIN B-12,) 1000 MCG/ML injection INJECT EVERY 4 WEEKS AS DIRECTED  6  . Cyanocobalamin (VITAMIN B-12 IJ) Inject 1 mL as directed every 30 (thirty) days.     Marland Kitchen glipiZIDE (GLUCOTROL) 10 MG tablet Take 10 mg by mouth 2 (two) times daily before a meal.    . hydrOXYzine (ATARAX/VISTARIL) 10 MG tablet Take 10 mg by mouth daily.    . insulin glargine (LANTUS) 100 UNIT/ML injection Inject 18 Units into the skin at bedtime.    . meclizine (ANTIVERT) 50 MG tablet Take 0.5 tablets (25 mg total) by mouth 3 (three) times daily as needed for dizziness. 30 tablet  0  . metFORMIN (GLUCOPHAGE) 1000 MG tablet Take 1,000 mg by mouth 2 (two) times daily with a meal.    . PARoxetine (PAXIL) 20 MG tablet Take 20 mg by mouth daily.    . ranitidine (ZANTAC) 150 MG tablet Take 150 mg by mouth as needed for heartburn.    . sertraline (ZOLOFT) 50 MG tablet Take 50 mg by mouth daily.    . simvastatin (ZOCOR) 40 MG tablet Take 40 mg by mouth every evening.    . traZODone (DESYREL) 100 MG tablet Take 100 mg by mouth at bedtime.     No current facility-administered medications for this visit.    REVIEW OF SYSTEMS:   Constitutional: Denies fevers, chills or abnormal night sweats Eyes: Denies blurriness of vision,  double vision or watery eyes Ears, nose, mouth, throat, and face: Denies mucositis or sore throat Respiratory: Denies cough, dyspnea or wheezes Cardiovascular: Denies palpitation, chest discomfort or lower extremity swelling Gastrointestinal:  Denies nausea, heartburn or change in bowel habits Skin: Denies abnormal skin rashes Lymphatics: Denies new lymphadenopathy or easy bruising Neurological:Denies numbness, tingling or new weaknesses Behavioral/Psych: Mood is stable, no new changes.  Anxiety/Nervousness Breast: bloody nipple discharge All other systems were reviewed with the patient and are negative. 14 point review of systems was performed and is negative except as detailed under history of present illness and above  PHYSICAL EXAMINATION: ECOG PERFORMANCE STATUS: 1 - Symptomatic but completely ambulatory  Filed Vitals:   04/28/15 1541  BP: 114/65  Pulse: 56  Temp: 98.1 F (36.7 C)  Resp: 20   Filed Weights   04/28/15 1541  Weight: 163 lb 9.6 oz (74.208 kg)    GENERAL:alert, no distress and comfortable SKIN: skin color, texture, turgor are normal, no rashes or significant lesions EYES: normal, conjunctiva are pink and non-injected, sclera clear OROPHARYNX:no exudate, no erythema and lips, buccal mucosa, and tongue normal  NECK:  supple, thyroid normal size, non-tender, without nodularity LYMPH:  no palpable lymphadenopathy in the cervical, axillary or inguinal LUNGS: clear to auscultation and percussion with normal breathing effort HEART: regular rate & rhythm and no murmurs and no lower extremity edema ABDOMEN:abdomen soft, non-tender and normal bowel sounds Musculoskeletal:no cyanosis of digits and no clubbing  PSYCH: alert & oriented x 3 with fluent speech NEURO: no focal motor/sensory deficits BREAST:bilateral bruising from recent biopsies but minimal  LABORATORY DATA:  I have reviewed the data as listed Lab Results  Component Value Date   WBC 5.8 10/14/2014   HGB 12.5 10/14/2014   HCT 37.2 10/14/2014   MCV 89.2 10/14/2014   PLT 231 10/14/2014   Lab Results  Component Value Date   NA 140 10/14/2014   K 3.8 10/14/2014   CL 106 10/14/2014   CO2 23 10/14/2014    RADIOGRAPHIC STUDIES: I have personally reviewed the radiological reports and agreed with the findings in the report.  CLINICAL DATA: Spontaneous bloody right nipple discharge noted by the patient in clinically.  LABS: Creatinine 1.0, GFR 55.  EXAM: BILATERAL BREAST MRI WITH AND WITHOUT CONTRAST  TECHNIQUE: Multiplanar, multisequence MR images of both breasts were obtained prior to and following the intravenous administration of 14 ml of MultiHance.  THREE-DIMENSIONAL MR IMAGE RENDERING ON INDEPENDENT WORKSTATION:  Three-dimensional MR images were rendered by post-processing of the original MR data on an independent workstation. The three-dimensional MR images were interpreted, and findings are reported in the following complete MRI report for this study. Three dimensional images were evaluated at the independent DynaCad workstation  COMPARISON: Mammograms dated 03/06/2015, 12/08/2014 and previous exams.  FINDINGS: Breast composition: b. Scattered fibroglandular tissue.  Background parenchymal enhancement:  Moderate.  Right breast: There is a T2 hyperintense circumscribed enhancing nodule in the right breast lower outer quadrant, middle depth, at approximately 8 o'clock, which measures 0.6 x 0.5 x 0.4 cm. It demonstrates mixed, mostly progressive, kinetics of enhancement. Dilated duct filled with T1 hyperintense, likely sanguineous fluid is seen leading from the nipple to this nodule. Other less than 5 mm scattered progressively enhancing foci in the right breast are felt to represent fibrocystic changes.  Left breast: There is a progressively enhancing T2 hyperintense circumscribed 4 mm nodule in the left 9 o'clock breast, anterior to middle depth. There is also a cluster of weak  nodular enhancement in the left 4 o'clock breast, posterior depth, with progressive kinetics of enhancement measuring approximately 1.0 x 1.0 cm in axial dimensions. This finding is mostly hypointense on T2. Similar to the right breast, multiple scattered T2 hyperintense progressively enhancing less than 4 mm foci are noted throughout the left breast, thought to represent fibrocystic changes.  Lymph nodes: No abnormal appearing lymph nodes.  Ancillary findings: None.  IMPRESSION: Right breast 8 o'clock middle depth 6 mm enhancing nodule, with associated dilated duct filled with sanguinous material, likely the cause of patient's single duct bloody nipple discharge.  Left breast 9 o'clock anterior to middle depth 4 mm progressively enhancing nodule, and left breast 4 o'clock 1 cm week nodular enhancement, with nonspecific appearance.  RECOMMENDATION: Bilateral second-look ultrasound is recommended. If second-look ultrasound is unrevealing, MRI guided core needle biopsy of the right 8 o'clock nodule, and 1 of the left breast areas of enhancement, at the discretion of the performing radiologist, is recommended.  BI-RADS CATEGORY 4: Suspicious.   Electronically Signed  By: Fidela Salisbury M.D.  On: 03/06/2015 14:42      ASSESSMENT AND PLAN:  Breast cancer of lower-outer quadrant of right female breast ([place]) Right breast biopsy 04/02/2015: 8:00: IDC with extracellular mucin, ER 100%, PR 50%, Ki-67 20%, grade 1, HER-2 negative ratio 1.11, T1b N0 stage I a clinical stage; left breast biopsy 4:00: Intraductal papilloma with UDH and fibrocystic changes.  MRI breasts 03/06/2015:Right breast 8:00 position: 6 mm enhancing nodule with associated dilated duct and with blood; left breast 9:00 middle depth: 4 mm nodule, 4:00: 1 cm nodule nonspecific appearance.  Pathology and radiology counseling:Discussed with the patient, the details of pathology including the type of breast cancer,the clinical staging, the significance of ER, PR and HER-2/neu receptors and the implications for treatment. After reviewing the pathology in detail, we proceeded to discuss the different treatment options between surgery, radiation, chemotherapy, antiestrogen therapies.  Recommendations: 1. Breast conserving surgery followed by 2. Oncotype DX testing based on the size of final pathology 3. Adjuvant radiation therapy followed by 4. Adjuvant antiestrogen therapy with aromatase inhibitors  Oncotype counseling: I discussed Oncotype DX test. I explained to the patient that this is a 21 gene panel to evaluate patient tumors DNA to calculate recurrence score. This would help determine whether patient has high risk or intermediate risk or low risk breast cancer. She understands that if her tumor was found to be high risk, she would benefit from systemic chemotherapy. If low risk, no need of chemotherapy. If she was found to be intermediate risk, we would need to evaluate the score as well as other risk factors and determine if an abbreviated chemotherapy may be of benefit.  I readdressed the issues discussed with the patient at her appointment with Dr. [name]. She was provided with reading information.  I  answered the multiple questions she had specifically regarding the oncotype assay, radiation (to the best of my ability, as I explained this was not my area of expertise) and endocrine therapy.     I suspect that it will show Low risk disease based on low grade and low ki 67. Her husband had multiple strokes and requires help from her.  I will have her touch base with Hildred Alamin our patient navigator today.  All questions were answered. The patient knows to call the clinic with any problems, questions or concerns.   This document serves as a record of services personally performed by Ancil Linsey, MD. It was created  on her behalf by Janace Hoard, a trained medical scribe. The creation of this record is based on the scribe's personal observations and the provider's statements to them. This document has been checked and approved by the attending provider.  I have reviewed the above documentation for accuracy and completeness, and I agree with the above.  This note was electronically signed.  Kelby Fam. Whitney Muse, MD

## 2015-04-30 ENCOUNTER — Encounter (HOSPITAL_COMMUNITY): Payer: Self-pay | Admitting: Hematology & Oncology

## 2015-05-06 ENCOUNTER — Encounter (HOSPITAL_BASED_OUTPATIENT_CLINIC_OR_DEPARTMENT_OTHER): Payer: Self-pay | Admitting: *Deleted

## 2015-05-08 ENCOUNTER — Ambulatory Visit
Admission: RE | Admit: 2015-05-08 | Discharge: 2015-05-08 | Disposition: A | Discharge status: Home / Self Care | Payer: Medicare Other | Source: Ambulatory Visit | Attending: Surgery | Admitting: Surgery

## 2015-05-08 DIAGNOSIS — D242 Benign neoplasm of left breast: Secondary | ICD-10-CM | POA: Diagnosis not present

## 2015-05-08 DIAGNOSIS — C779 Secondary and unspecified malignant neoplasm of lymph node, unspecified: Principal | ICD-10-CM

## 2015-05-08 DIAGNOSIS — C50911 Malignant neoplasm of unspecified site of right female breast: Secondary | ICD-10-CM | POA: Diagnosis not present

## 2015-05-11 ENCOUNTER — Encounter (HOSPITAL_COMMUNITY)
Admission: RE | Admit: 2015-05-11 | Discharge: 2015-05-11 | Disposition: A | Discharge status: Home / Self Care | Payer: Medicare Other | Source: Ambulatory Visit | Attending: Surgery | Admitting: Surgery

## 2015-05-11 DIAGNOSIS — N6092 Unspecified benign mammary dysplasia of left breast: Secondary | ICD-10-CM | POA: Diagnosis not present

## 2015-05-11 DIAGNOSIS — Z7901 Long term (current) use of anticoagulants: Secondary | ICD-10-CM | POA: Diagnosis not present

## 2015-05-11 DIAGNOSIS — E119 Type 2 diabetes mellitus without complications: Secondary | ICD-10-CM | POA: Diagnosis not present

## 2015-05-11 DIAGNOSIS — Z8673 Personal history of transient ischemic attack (TIA), and cerebral infarction without residual deficits: Secondary | ICD-10-CM | POA: Diagnosis not present

## 2015-05-11 DIAGNOSIS — F329 Major depressive disorder, single episode, unspecified: Secondary | ICD-10-CM | POA: Diagnosis not present

## 2015-05-11 DIAGNOSIS — F419 Anxiety disorder, unspecified: Secondary | ICD-10-CM | POA: Diagnosis not present

## 2015-05-11 DIAGNOSIS — C50911 Malignant neoplasm of unspecified site of right female breast: Secondary | ICD-10-CM | POA: Diagnosis not present

## 2015-05-11 LAB — BASIC METABOLIC PANEL
Anion gap: 12 (ref 5–15)
BUN: 16 mg/dL (ref 6–20)
CALCIUM: 9.7 mg/dL (ref 8.9–10.3)
CO2: 24 mmol/L (ref 22–32)
Chloride: 102 mmol/L (ref 101–111)
Creatinine, Ser: 1.08 mg/dL — ABNORMAL HIGH (ref 0.44–1.00)
GFR calc Af Amer: 59 mL/min — ABNORMAL LOW (ref >60)
GFR, EST NON AFRICAN AMERICAN: 51 mL/min — AB (ref >60)
GLUCOSE: 320 mg/dL — AB (ref 65–99)
POTASSIUM: 4.4 mmol/L (ref 3.5–5.1)
SODIUM: 138 mmol/L (ref 135–145)

## 2015-05-11 LAB — CBC WITH DIFFERENTIAL/PLATELET
BASOS ABS: 0 10*3/uL (ref 0.0–0.1)
BASOS PCT: 0 %
EOS ABS: 0 10*3/uL (ref 0.0–0.7)
Eosinophils Relative: 1 %
HEMATOCRIT: 39.3 % (ref 36.0–46.0)
Hemoglobin: 13.1 g/dL (ref 12.0–15.0)
Lymphocytes Relative: 34 %
Lymphs Abs: 2.4 10*3/uL (ref 0.7–4.0)
MCH: 30.2 pg (ref 26.0–34.0)
MCHC: 33.3 g/dL (ref 30.0–36.0)
MCV: 90.6 fL (ref 78.0–100.0)
MONO ABS: 0.6 10*3/uL (ref 0.1–1.0)
Monocytes Relative: 8 %
NEUTROS ABS: 4.1 10*3/uL (ref 1.7–7.7)
NEUTROS PCT: 57 %
Platelets: 232 10*3/uL (ref 150–400)
RBC: 4.34 MIL/uL (ref 3.87–5.11)
RDW: 13.4 % (ref 11.5–15.5)
WBC: 7.1 10*3/uL (ref 4.0–10.5)

## 2015-05-11 LAB — PROTIME-INR
INR: 1.04 (ref 0.00–1.49)
PROTHROMBIN TIME: 13.8 s (ref 11.6–15.2)

## 2015-05-11 LAB — APTT: APTT: 27 s (ref 24–37)

## 2015-05-12 NOTE — H&P (Signed)
Olivia Mora. Olivia Mora  Location: [facility] Patient #: [id number] DOB: 06-25-1946 Married / Language: English / Race: White Female   History of Present Illness    The patient is a 69 year old female who presents with a complaint of nipple discharge.   Her PCP is Olivia Mora.  She comes by herself.   Olivia Mora is difficult to talk to, in that she has trouble following her findings and diagnosis.  Right breast biopsy 8 o'clock - 04/02/2015 - [id number] - IDC, ER - 100%, PR - 50%, Ki67- 20%, Her2Neu - neg. The left breast biopsy showed a papilloma.   It sounds like she has held her coumadin until her surgery. I recommended she take the coumadin up until 5 days until surgery - but I am not sure that she is going to do that.  I discussed the options for breast cancer treatment with the patient. I discussed a multidisciplinary approach to the treatment of breast cancer, which includes medical oncology and radiation oncology. I discussed the surgical options of lumpectomy vs. mastectomy. If mastectomy, there is the possibility of reconstruction. I discussed the options of lymph node biopsy. The treatment plan depends on the pathologic staging of the tumor and the patient's personal wishes. The risks of surgery include, but are not limited to, bleeding, infection, the need for further surgery, and nerve injury. The patient has been given literature on the treatment of breast cancer.  Plan: 1) She will need a right breast lumpectomy and right axillary SLNBx. I will have her see med onc and rad onc prior to any surgery. 2) she will need a left breast biopsy to remove the left breast papilloma, 3) she has taken upon herself to stop her coumadin. I have recommended continuing the coumadin up until 5 days before surgery.  Breast Problem: The patient has noticed a spontaneous right nipple discharge. She had mammograms at [facility] on 08 Dec 2014  at the breast center that showed mildly dilated ducts but no specific mass. They then attempted a ductogram unsuccessfully. It looks like the ductogram was attempted twice. There was a recommendation to consider a contrast enhanced breast MRI by Olivia Mora. In Epic, there is a cytology of a right nipple discharge - 12/02/2014 - [id number] - no malignant cells identified (obtained by Olivia Mora) She was given an appt on 01/26/2015 with our office, but when the nipple discharge stopped, she canceled the appt. Note the nipple discharge started again a couple of days ago (though she is vague in the timing of her symptoms) and she was brought in the Urgent Office.  She had an breast MRI on 02/13/2015. Her results - Bilateral second-look ultrasound is recommended. If second-look ultrasound is unrevealing, MRI guided core needle biopsy of the right 8 o'clock nodule, and 1 of the left breast areas of enhancement, at the discretion of the performing radiologist, is recommended.  Past Medical History: 1. History of small stroke  She says that she still have vertigo in the left side of her head.  MRI of brain on 10/15/2014 - Remote small LEFT cerebellar infarcts. 2. DM x 12 years 3. Appendectomy at age 46 4. On coumadin  This is for blood clots.  The last INR in Epic - 02/10/2015 - 2.7 (Olivia Mora office) 5. Depression/anxiety  Social History:  She is married. But her husband has had several strokes. She has 2 children - 30 yo son and 26 yo daughter. It sounds  like her daughter and she are estranged.   Other Problems Olivia Mora; 04/09/2015 11:03 AM) Anxiety Disorder Cerebrovascular Accident Depression Diabetes Mellitus  Past Surgical History Olivia Mora; 04/09/2015 11:03 AM) Appendectomy Colon Polyp Removal - Open  Allergies Olivia Lorenzo, Mora; 11/03/6220 9:79 AM) No Known Drug Allergies08/25/2016  Medication History  Olivia Lorenzo, Mora; 8/92/1194 1:74 AM) Cyanocobalamin (1000MCG/ML Solution, Injection) Active. GlipiZIDE ER (5MG Tablet ER 24HR, Oral) Active. Lantus SoloStar (100UNIT/ML Soln Pen-inj, Subcutaneous) Active. Meclizine HCl (25MG Tablet, Oral) Active. MetFORMIN HCl (1000MG Tablet, Oral) Active. OneTouch Ultra Blue (In Vitro) Active. PARoxetine HCl (20MG Tablet, Oral) Active. Simvastatin (40MG Tablet, Oral) Active. Sertraline HCl (50MG Tablet, Oral as needed) Active. Warfarin Sodium (4MG Tablet, Oral) Active. TraZODone HCl (100MG Tablet, Oral) Active. Medications Reconciled  Vitals [name] Mora; 0/81/4481 8:56 AM) 04/09/2015 9:55 AM Weight: 160.4 lb Height: 63in Body Surface Area: 1.8 m Body Mass Index: 28.41 kg/m  Temp.: 98.7F(Oral)  Pulse: 68 (Regular)  BP: 102/72 (Sitting, Left Arm, Standard)   Physical Exam  General: WF who is a poor historian. HEENT: Normal. Pupils equal.  Neck: Supple. No mass. No thyroid mass.  Lymph Nodes: No supraclavicular or cervical nodes.  Lungs: Clear to auscultation and symmetric breath sounds. Heart: RRR. No murmur or rub.  Breasts: Right - bruise at 8 o'clock of the right breast Left - Bruise at the 4 o'clock of the left breast. She feels a scar at the biopsy site. The left breast bruise is worse than the right.  Abdomen: Soft. No mass. No tenderness. No hernia. Normal bowel sounds.   Assessment & Plan  1.  BREAST CANCER, STAGE 1, RIGHT (C50.911)  Story: Right breast biopsy 8 o'clock - 04/02/2015 - [id number] - IDC, ER - 100%, PR - 50%, Ki67- 20%, Her2Neu - neg Impression: Plan:  1) She will need a right breast lumpectomy and right axillary SLNBx. I will have her see med onc and rad onc prior to any surgery.   2) she will need a left breast biopsy to remove the left breast papilloma,   3) she has taken upon herself to stop her coumadin. I have recommended continuing the coumadin up until 5 days  before surgery.   4) Will schedule her sugery after seeing oncology Saw Dr. Lindi Mora 04/22/2015 Olivia Mora and she is agreeable to lumpectomy and XRT (at [facility]). I suggsted Olivia Mora follow up after surgery. If the final path shows bigger tumor, then Oncotype can be considered. Othervise XRT foll by AI.  2.  PAPILLOMA OF BREAST, LEFT (D24.2) Impression: Plan excision at the same time as the surgery for the right breast cancer.  3. History of small stroke  She says that she still have vertigo in the left side of her head.  MRI of brain on 10/15/2014 - Remote small LEFT cerebellar infarcts. 4. DM x 12 years 5. Appendectomy at age 71 6. On coumadin  This is for blood clots. The last INR in Epic - 02/10/2015 - 2.7 (Olivia Mora office) 7. Depression/anxiety  [name], Mora, [facility] Pager: [phone] Office phone:  [phone]

## 2015-05-13 ENCOUNTER — Ambulatory Visit
Admission: RE | Admit: 2015-05-13 | Discharge: 2015-05-13 | Disposition: A | Discharge status: Home / Self Care | Payer: Medicare Other | Source: Ambulatory Visit | Attending: Surgery | Admitting: Surgery

## 2015-05-13 ENCOUNTER — Ambulatory Visit (HOSPITAL_BASED_OUTPATIENT_CLINIC_OR_DEPARTMENT_OTHER)
Admission: RE | Admit: 2015-05-13 | Discharge: 2015-05-13 | Disposition: A | Discharge status: Home / Self Care | Payer: Medicare Other | Source: Ambulatory Visit | Attending: Surgery | Admitting: Surgery

## 2015-05-13 ENCOUNTER — Ambulatory Visit (HOSPITAL_COMMUNITY)
Admission: RE | Admit: 2015-05-13 | Discharge: 2015-05-13 | Disposition: A | Discharge status: Home / Self Care | Payer: Medicare Other | Source: Ambulatory Visit | Attending: Surgery | Admitting: Surgery

## 2015-05-13 ENCOUNTER — Encounter (HOSPITAL_BASED_OUTPATIENT_CLINIC_OR_DEPARTMENT_OTHER)
Admission: RE | Disposition: A | Discharge status: Home / Self Care | Payer: Self-pay | Source: Ambulatory Visit | Attending: Surgery

## 2015-05-13 ENCOUNTER — Ambulatory Visit (HOSPITAL_BASED_OUTPATIENT_CLINIC_OR_DEPARTMENT_OTHER): Payer: Medicare Other | Admitting: Anesthesiology

## 2015-05-13 ENCOUNTER — Encounter (HOSPITAL_BASED_OUTPATIENT_CLINIC_OR_DEPARTMENT_OTHER): Payer: Self-pay | Admitting: *Deleted

## 2015-05-13 DIAGNOSIS — C779 Secondary and unspecified malignant neoplasm of lymph node, unspecified: Principal | ICD-10-CM

## 2015-05-13 DIAGNOSIS — Z7901 Long term (current) use of anticoagulants: Secondary | ICD-10-CM | POA: Insufficient documentation

## 2015-05-13 DIAGNOSIS — E119 Type 2 diabetes mellitus without complications: Secondary | ICD-10-CM | POA: Diagnosis not present

## 2015-05-13 DIAGNOSIS — F419 Anxiety disorder, unspecified: Secondary | ICD-10-CM | POA: Insufficient documentation

## 2015-05-13 DIAGNOSIS — R921 Mammographic calcification found on diagnostic imaging of breast: Secondary | ICD-10-CM | POA: Diagnosis not present

## 2015-05-13 DIAGNOSIS — C50911 Malignant neoplasm of unspecified site of right female breast: Secondary | ICD-10-CM | POA: Insufficient documentation

## 2015-05-13 DIAGNOSIS — Z8673 Personal history of transient ischemic attack (TIA), and cerebral infarction without residual deficits: Secondary | ICD-10-CM | POA: Insufficient documentation

## 2015-05-13 DIAGNOSIS — N6092 Unspecified benign mammary dysplasia of left breast: Secondary | ICD-10-CM | POA: Diagnosis not present

## 2015-05-13 DIAGNOSIS — Z853 Personal history of malignant neoplasm of breast: Secondary | ICD-10-CM | POA: Diagnosis not present

## 2015-05-13 DIAGNOSIS — R079 Chest pain, unspecified: Secondary | ICD-10-CM | POA: Diagnosis not present

## 2015-05-13 DIAGNOSIS — N6082 Other benign mammary dysplasias of left breast: Secondary | ICD-10-CM | POA: Diagnosis not present

## 2015-05-13 DIAGNOSIS — F329 Major depressive disorder, single episode, unspecified: Secondary | ICD-10-CM | POA: Diagnosis not present

## 2015-05-13 DIAGNOSIS — D242 Benign neoplasm of left breast: Secondary | ICD-10-CM

## 2015-05-13 DIAGNOSIS — Z4689 Encounter for fitting and adjustment of other specified devices: Secondary | ICD-10-CM | POA: Diagnosis not present

## 2015-05-13 DIAGNOSIS — N6012 Diffuse cystic mastopathy of left breast: Secondary | ICD-10-CM | POA: Diagnosis not present

## 2015-05-13 DIAGNOSIS — G8918 Other acute postprocedural pain: Secondary | ICD-10-CM | POA: Diagnosis not present

## 2015-05-13 HISTORY — PX: BREAST LUMPECTOMY WITH RADIOACTIVE SEED LOCALIZATION: SHX6424

## 2015-05-13 HISTORY — PX: BREAST LUMPECTOMY WITH RADIOACTIVE SEED AND SENTINEL LYMPH NODE BIOPSY: SHX6550

## 2015-05-13 LAB — GLUCOSE, CAPILLARY
GLUCOSE-CAPILLARY: 224 mg/dL — AB (ref 65–99)
GLUCOSE-CAPILLARY: 236 mg/dL — AB (ref 65–99)

## 2015-05-13 SURGERY — BREAST LUMPECTOMY WITH RADIOACTIVE SEED AND SENTINEL LYMPH NODE BIOPSY
Anesthesia: Regional | Site: Breast | Laterality: Right

## 2015-05-13 MED ORDER — CEFAZOLIN SODIUM-DEXTROSE 2-3 GM-% IV SOLR
INTRAVENOUS | Status: AC
  Filled 2015-05-13: qty 50

## 2015-05-13 MED ORDER — DEXAMETHASONE SODIUM PHOSPHATE 10 MG/ML IJ SOLN
INTRAMUSCULAR | Status: AC
  Filled 2015-05-13: qty 1

## 2015-05-13 MED ORDER — SODIUM CHLORIDE 0.9 % IR SOLN
Status: DC | PRN
  Administered 2015-05-13: 200 mL

## 2015-05-13 MED ORDER — GLYCOPYRROLATE 0.2 MG/ML IJ SOLN: 0.2000 mg | Freq: Once | INTRAMUSCULAR | Status: DC | PRN

## 2015-05-13 MED ORDER — SUCCINYLCHOLINE CHLORIDE 20 MG/ML IJ SOLN
INTRAMUSCULAR | Status: AC
  Filled 2015-05-13: qty 1

## 2015-05-13 MED ORDER — CHLORHEXIDINE GLUCONATE 4 % EX LIQD: 1.0000 "application " | Freq: Once | CUTANEOUS | Status: DC

## 2015-05-13 MED ORDER — FENTANYL CITRATE (PF) 100 MCG/2ML IJ SOLN
INTRAMUSCULAR | Status: AC
  Filled 2015-05-13: qty 2

## 2015-05-13 MED ORDER — BUPIVACAINE-EPINEPHRINE (PF) 0.5% -1:200000 IJ SOLN
INTRAMUSCULAR | Status: DC | PRN
  Administered 2015-05-13: 25 mL

## 2015-05-13 MED ORDER — MIDAZOLAM HCL 2 MG/2ML IJ SOLN
1.0000 mg | INTRAMUSCULAR | Status: DC | PRN
Start: 2015-05-13 — End: 2015-05-13
  Administered 2015-05-13: 2 mg via INTRAVENOUS

## 2015-05-13 MED ORDER — PROMETHAZINE HCL 25 MG/ML IJ SOLN: 6.2500 mg | INTRAMUSCULAR | Status: DC | PRN

## 2015-05-13 MED ORDER — TECHNETIUM TC 99M SULFUR COLLOID FILTERED
1.0000 | Freq: Once | INTRAVENOUS | Status: AC | PRN
  Administered 2015-05-13: 1 via INTRADERMAL

## 2015-05-13 MED ORDER — MIDAZOLAM HCL 2 MG/2ML IJ SOLN
INTRAMUSCULAR | Status: AC
  Filled 2015-05-13: qty 2

## 2015-05-13 MED ORDER — HYDROMORPHONE HCL 1 MG/ML IJ SOLN: 0.2500 mg | INTRAMUSCULAR | Status: DC | PRN

## 2015-05-13 MED ORDER — HYDROCODONE-ACETAMINOPHEN 7.5-325 MG PO TABS: 1.0000 | ORAL_TABLET | Freq: Once | ORAL | Status: DC | PRN

## 2015-05-13 MED ORDER — FENTANYL CITRATE (PF) 100 MCG/2ML IJ SOLN
50.0000 ug | INTRAMUSCULAR | Status: AC | PRN
  Administered 2015-05-13: 100 ug via INTRAVENOUS
  Administered 2015-05-13 (×2): 50 ug via INTRAVENOUS

## 2015-05-13 MED ORDER — ONDANSETRON HCL 4 MG/2ML IJ SOLN
INTRAMUSCULAR | Status: DC | PRN
  Administered 2015-05-13: 4 mg via INTRAVENOUS

## 2015-05-13 MED ORDER — DEXAMETHASONE SODIUM PHOSPHATE 4 MG/ML IJ SOLN
INTRAMUSCULAR | Status: DC | PRN
  Administered 2015-05-13: 4 mg via INTRAVENOUS

## 2015-05-13 MED ORDER — LACTATED RINGERS IV SOLN
INTRAVENOUS | Status: DC
  Administered 2015-05-13 (×2): via INTRAVENOUS

## 2015-05-13 MED ORDER — HYDROCODONE-ACETAMINOPHEN 5-325 MG PO TABS
1.0000 | ORAL_TABLET | Freq: Once | ORAL | Status: AC
  Administered 2015-05-13: 1 via ORAL

## 2015-05-13 MED ORDER — CEFAZOLIN SODIUM-DEXTROSE 2-3 GM-% IV SOLR
2.0000 g | INTRAVENOUS | Status: AC
  Administered 2015-05-13: 2 g via INTRAVENOUS

## 2015-05-13 MED ORDER — ONDANSETRON HCL 4 MG/2ML IJ SOLN
INTRAMUSCULAR | Status: AC
  Filled 2015-05-13: qty 2

## 2015-05-13 MED ORDER — BUPIVACAINE-EPINEPHRINE 0.25% -1:200000 IJ SOLN
INTRAMUSCULAR | Status: DC | PRN
  Administered 2015-05-13: 20 mL

## 2015-05-13 MED ORDER — SCOPOLAMINE 1 MG/3DAYS TD PT72: 1.0000 | MEDICATED_PATCH | Freq: Once | TRANSDERMAL | Status: DC | PRN

## 2015-05-13 MED ORDER — HYDROCODONE-ACETAMINOPHEN 5-325 MG PO TABS
ORAL_TABLET | ORAL | Status: AC
  Filled 2015-05-13: qty 1

## 2015-05-13 MED ORDER — PROPOFOL 500 MG/50ML IV EMUL
INTRAVENOUS | Status: AC
  Filled 2015-05-13: qty 50

## 2015-05-13 MED ORDER — EPHEDRINE SULFATE 50 MG/ML IJ SOLN
INTRAMUSCULAR | Status: AC
  Filled 2015-05-13: qty 1

## 2015-05-13 MED ORDER — LIDOCAINE HCL (CARDIAC) 20 MG/ML IV SOLN
INTRAVENOUS | Status: AC
  Filled 2015-05-13: qty 5

## 2015-05-13 MED ORDER — HYDROCODONE-ACETAMINOPHEN 5-325 MG PO TABS: 1.0000 | ORAL_TABLET | Freq: Four times a day (QID) | ORAL | Status: DC | PRN

## 2015-05-13 MED ORDER — PROPOFOL 10 MG/ML IV BOLUS
INTRAVENOUS | Status: DC | PRN
  Administered 2015-05-13: 160 mg via INTRAVENOUS

## 2015-05-13 SURGICAL SUPPLY — 61 items
APL SKNCLS STERI-STRIP NONHPOA (GAUZE/BANDAGES/DRESSINGS)
BENZOIN TINCTURE PRP APPL 2/3 (GAUZE/BANDAGES/DRESSINGS) IMPLANT
BINDER BREAST LRG (GAUZE/BANDAGES/DRESSINGS) ×2 IMPLANT
BINDER BREAST XLRG (GAUZE/BANDAGES/DRESSINGS) IMPLANT
BINDER BREAST XXLRG (GAUZE/BANDAGES/DRESSINGS) IMPLANT
BLADE HEX COATED 2.75 (ELECTRODE) IMPLANT
BLADE SURG 10 STRL SS (BLADE) ×4 IMPLANT
BLADE SURG 15 STRL LF DISP TIS (BLADE) ×2 IMPLANT
BLADE SURG 15 STRL SS (BLADE) ×4
CANISTER SUC SOCK COL 7IN (MISCELLANEOUS) IMPLANT
CANISTER SUCT 1200ML W/VALVE (MISCELLANEOUS) ×4 IMPLANT
CHLORAPREP W/TINT 26ML (MISCELLANEOUS) ×4 IMPLANT
CLIP TI WIDE RED SMALL 6 (CLIP) ×6 IMPLANT
CLOSURE WOUND 1/2 X4 (GAUZE/BANDAGES/DRESSINGS)
CLOSURE WOUND 1/4X4 (GAUZE/BANDAGES/DRESSINGS)
COVER BACK TABLE 60X90IN (DRAPES) ×4 IMPLANT
COVER MAYO STAND STRL (DRAPES) ×4 IMPLANT
COVER PROBE W GEL 5X96 (DRAPES) ×4 IMPLANT
DECANTER SPIKE VIAL GLASS SM (MISCELLANEOUS) IMPLANT
DEVICE DUBIN W/COMP PLATE 8390 (MISCELLANEOUS) ×4 IMPLANT
DRAPE LAPAROSCOPIC ABDOMINAL (DRAPES) ×4 IMPLANT
DRAPE LAPAROTOMY 100X72 PEDS (DRAPES) ×2 IMPLANT
DRAPE UTILITY XL STRL (DRAPES) ×4 IMPLANT
DRSG PAD ABDOMINAL 8X10 ST (GAUZE/BANDAGES/DRESSINGS) ×4 IMPLANT
ELECT COATED BLADE 2.86 ST (ELECTRODE) ×4 IMPLANT
ELECT REM PT RETURN 9FT ADLT (ELECTROSURGICAL) ×4
ELECTRODE REM PT RTRN 9FT ADLT (ELECTROSURGICAL) ×2 IMPLANT
GAUZE SPONGE 4X4 12PLY STRL (GAUZE/BANDAGES/DRESSINGS) ×4 IMPLANT
GLOVE BIO SURGEON STRL SZ 6.5 (GLOVE) ×1 IMPLANT
GLOVE BIO SURGEONS STRL SZ 6.5 (GLOVE) ×1
GLOVE BIOGEL PI IND STRL 7.0 (GLOVE) IMPLANT
GLOVE BIOGEL PI INDICATOR 7.0 (GLOVE) ×2
GLOVE SURG SIGNA 7.5 PF LTX (GLOVE) ×8 IMPLANT
GOWN STRL REUS W/ TWL LRG LVL3 (GOWN DISPOSABLE) ×2 IMPLANT
GOWN STRL REUS W/ TWL XL LVL3 (GOWN DISPOSABLE) ×2 IMPLANT
GOWN STRL REUS W/TWL LRG LVL3 (GOWN DISPOSABLE) ×4
GOWN STRL REUS W/TWL XL LVL3 (GOWN DISPOSABLE) ×4
KIT MARKER MARGIN INK (KITS) ×4 IMPLANT
LIQUID BAND (GAUZE/BANDAGES/DRESSINGS) ×6 IMPLANT
NDL HYPO 25X1 1.5 SAFETY (NEEDLE) ×2 IMPLANT
NDL SAFETY ECLIPSE 18X1.5 (NEEDLE) IMPLANT
NEEDLE HYPO 18GX1.5 SHARP (NEEDLE)
NEEDLE HYPO 25X1 1.5 SAFETY (NEEDLE) ×4 IMPLANT
NS IRRIG 1000ML POUR BTL (IV SOLUTION) ×4 IMPLANT
PACK BASIN DAY SURGERY FS (CUSTOM PROCEDURE TRAY) ×4 IMPLANT
PENCIL BUTTON HOLSTER BLD 10FT (ELECTRODE) ×4 IMPLANT
SHEET MEDIUM DRAPE 40X70 STRL (DRAPES) ×6 IMPLANT
SLEEVE SCD COMPRESS KNEE MED (MISCELLANEOUS) ×4 IMPLANT
SPONGE GAUZE 4X4 12PLY STER LF (GAUZE/BANDAGES/DRESSINGS) IMPLANT
SPONGE LAP 18X18 X RAY DECT (DISPOSABLE) ×6 IMPLANT
STRIP CLOSURE SKIN 1/2X4 (GAUZE/BANDAGES/DRESSINGS) IMPLANT
STRIP CLOSURE SKIN 1/4X4 (GAUZE/BANDAGES/DRESSINGS) IMPLANT
SUT MNCRL AB 4-0 PS2 18 (SUTURE) ×4 IMPLANT
SUT MON AB 5-0 PS2 18 (SUTURE) ×4 IMPLANT
SUT VICRYL 3-0 CR8 SH (SUTURE) ×4 IMPLANT
SYR CONTROL 10ML LL (SYRINGE) ×4 IMPLANT
TOWEL OR 17X24 6PK STRL BLUE (TOWEL DISPOSABLE) ×4 IMPLANT
TOWEL OR NON WOVEN STRL DISP B (DISPOSABLE) ×4 IMPLANT
TUBE CONNECTING 20'X1/4 (TUBING) ×1
TUBE CONNECTING 20X1/4 (TUBING) ×3 IMPLANT
YANKAUER SUCT BULB TIP NO VENT (SUCTIONS) ×4 IMPLANT

## 2015-05-13 NOTE — Anesthesia Preprocedure Evaluation (Addendum)
Anesthesia Evaluation  Patient identified by MRN, date of birth, ID band Patient awake    Reviewed: Allergy & Precautions, NPO status , Patient's Chart, lab work & pertinent test results  Airway Mallampati: II  TM Distance: >3 FB Neck ROM: Full    Dental no notable dental hx.    Pulmonary neg pulmonary ROS,    Pulmonary exam normal breath sounds clear to auscultation       Cardiovascular negative cardio ROS Normal cardiovascular exam+ Valvular Problems/Murmurs MR  Rhythm:Regular Rate:Normal     Neuro/Psych negative neurological ROS  negative psych ROS   GI/Hepatic Neg liver ROS, GERD  ,  Endo/Other  diabetes, Type 2, Insulin Dependent, Oral Hypoglycemic Agents  Renal/GU negative Renal ROS     Musculoskeletal negative musculoskeletal ROS (+)   Abdominal   Peds  Hematology negative hematology ROS (+)   Anesthesia Other Findings   Reproductive/Obstetrics negative OB ROS                            Anesthesia Physical Anesthesia Plan  ASA: III  Anesthesia Plan: General and Regional   Post-op Pain Management: GA combined w/ Regional for post-op pain   Induction: Intravenous  Airway Management Planned:   Additional Equipment:   Intra-op Plan:   Post-operative Plan: Extubation in OR  Informed Consent: I have reviewed the patients History and Physical, chart, labs and discussed the procedure including the risks, benefits and alternatives for the proposed anesthesia with the patient or authorized representative who has indicated his/her understanding and acceptance.   Dental advisory given  Plan Discussed with: CRNA  Anesthesia Plan Comments:        Anesthesia Quick Evaluation

## 2015-05-13 NOTE — Op Note (Signed)
05/13/2015  12:36 PM  PATIENT:  Olivia Mora DOB: [birth year] MRN: [id number]  PREOP DIAGNOSIS:  left breast papilloma, right breast cancer  POSTOP DIAGNOSIS:   Left breast papilloma at 5 o'clock, Right breast cancer, 8 o'clock position (T1, N0)  PROCEDURE:   Procedure(s):  RIGHT BREAST LUMPECTOMY WITH RADIOACTIVE SEED AND RIGHT SENTINEL LYMPH NODE BIOPSY. LEFT BREAST LUMPECTOMY WITH RADIOACTIVE SEED LOCALIZATION  SURGEON:   [name], M.D.  ANESTHESIA:   general  Anesthesiologist: Suzette Battiest, MD CRNA: Baxter Flattery, CRNA; Willa Frater, CRNA  General  EBL:  minimal  ml  DRAINS: none   LOCAL MEDICATIONS USED:   20 cc of 1/4% marcaine, right chest wall block  SPECIMEN:   Left breast lumpectomy (suture medial and painted), right breast lumpectomy (suture medial and painted), right axillary sentinel lymph node  COUNTS CORRECT:  YES  INDICATIONS FOR PROCEDURE:  Olivia Mora is a 69 y.o. (DOB: 1946/07/14) white  female whose primary care physician is FAGAN,ROY, MD and comes for left and right breast lumpectomy and right axillary sentinel lymph node biopsy.   Ms. Surratt underwent bilateral breast biopsies.  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  The left breast biopsy showed a papilloma.  I will excise both areas.   The options for breast cancer treatment have been discussed with the patient. She elected to proceed with lumpectomy and axillary sentinel lymph node.     The indications and potential complications of surgery were explained to the patient. Potential complications include, but are not limited to, bleeding, infection, the need for further surgery, and nerve injury.     She had a I131 seed placed on 05/08/2015 in her left and right breast breast at [facility].  I confirmed the presence of the I131 seed in the pre op area using the Neoprobe.  The seed is in  the 5 o'clock position of the left breast and the 8 o'clock position of the right breast.   In the holding area, her right areola was injected with 1 millicurie of Technitium Sulfur Colloid.  OPERATIVE NOTE:   The patient was taken to room # 8 at [facility] where she underwent a general anesthesia  supervised by Anesthesiologist: Suzette Battiest, MD CRNA: Baxter Flattery, CRNA; Willa Frater, CRNA. Both her breast and axilla were prepped with  ChloraPrep and sterilely draped.    A time-out and the surgical check list was reviewed.    First I did an excision of the left breast, which represented the benign lesion.  The lesion was at about 5 o'clock position of the left breast.   I used the Neoprobe to identify the I131 seed.  I tried to excise an area around the tumor of at least 1 cm.    I excised this block of breast tissue approximately 4 cm by 3 cm  in diameter.  The radioactive seed came out of the speciman and I captured the seed and sent it in a separate cup.  On the lumpectomy, I painted the lumpectomy specimen with the 6 color paint kit and did a specimen mammogram.  They did see a clip in the specimen, though I found a "blue" foreign body in the wound that I thought was part of the clip.      The specimen was sent to pathology who called back to confirm  that they have the seed and the specimen.   `I turned attention to the cancer which was about at the 8 o'clock position of the right breast.   I used the Neoprobe to identify the I131 seed.  I tried to excise an area around the tumor of at least 1 cm.    I excised this block of breast tissue approximately 4 cm by 5 cm  in diameter.   I painted the lumpectomy specimen with the 6 color paint kit and did a specimen mammogram which confirmed the mass, clip, and the seed were all in the right position in the specimen.  The specimen was sent to pathology who called back to confirm that they have the seed and the specimen.    I then started the  right axillary sentinel lymph node biopsy. I made an incision in the right axilla.  I found a hot area at the junction of the breast and the pectoralis major muscle. I cut down and  identified a hot node that had counts of 120 and the background has 10 counts.  I checked her internal mammary nodes and supraclavicular nodes with the neoprobe and found no other hot area. The axillary node was then sent to pathology.    I then irrigated the wound with saline. I infiltrated approximately 20 mL of 1/4% local between the incisions.  She had a block placed by anesthesia on the right.   I placed 6 clips to mark the right biopsy cavity, at 12, 3, 6, and 9 o'clock. Two clips were placed on the pectoralis major.  I did not put clips on the left.    I then closed all the wounds in layers using 3-0 Vicryl sutures for the deep layer. At the skin, I closed the incisions with a 5-0 Monocryl suture. The incisions were then painted with LiquiBand.  She had gauze place over the wounds and placed in a breast binder.   The patient tolerated the procedure well, was transported to the recovery room in good condition. Sponge and needle count were correct at the end of the case.   Final pathology is pending.   [name], MD, [facility] Pager: [phone] Office phone:  [phone]

## 2015-05-13 NOTE — Interval H&P Note (Signed)
History and Physical Interval Note:  05/13/2015 10:37 AM  Olivia Mora  has presented today for surgery, with the diagnosis of left breast papilloma, right breast cancer  The various methods of treatment have been discussed with the patient and family.  Seed identified on both sides.  Sister and son at bedside.  The husband is also there.  After consideration of risks, benefits and other options for treatment, the patient has consented to  Procedure(s):  RIGHT BREAST LUMPECTOMY WITH RADIOACTIVE SEED AND RIGHT SENTINEL LYMPH NODE BIOPSY (Right) LEFT BREAST LUMPECTOMY WITH RADIOACTIVE SEED LOCALIZATION (Left) as a surgical intervention .  The patient's history has been reviewed, patient examined, no change in status, stable for surgery.  I have reviewed the patient's chart and labs.  Questions were answered to the patient's satisfaction.     [name]

## 2015-05-13 NOTE — Anesthesia Postprocedure Evaluation (Signed)
  Anesthesia Post-op Note  Patient: Olivia Mora  Procedure(s) Performed: Procedure(s):  RIGHT BREAST LUMPECTOMY WITH RADIOACTIVE SEED AND RIGHT SENTINEL LYMPH NODE BIOPSY (Right) LEFT BREAST LUMPECTOMY WITH RADIOACTIVE SEED LOCALIZATION (Left)  Patient Location: PACU  Anesthesia Type:GA combined with regional for post-op pain  Level of Consciousness: awake and alert   Airway and Oxygen Therapy: Patient Spontanous Breathing  Post-op Pain: none  Post-op Assessment: Post-op Vital signs reviewed              Post-op Vital Signs: Reviewed  Last Vitals:  Filed Vitals:   05/13/15 1356  BP: 126/66  Pulse: 70  Temp: 36.4 C  Resp: 20    Complications: No apparent anesthesia complications

## 2015-05-13 NOTE — Discharge Instructions (Signed)
[facility] - DISCHARGE INSTRUCTIONS TO PATIENT  Activity:  Driving - May drive in 2 or 3 days, if doing well and on no pain meds   Lifting - No lifting more than 15 pounds for 7 days, then no limit  Wound Care:   Leave binder and bandages on for 2 days, then may remove and shower.  Diet:  As tolerated  Follow up appointment:  Call Dr. Pollie Friar office [facility]) at [phone] for an appointment in 2 to 3 weeks.  Medications and dosages:  Resume your home medications.  You have a prescription for:  Vicodin  Call Dr. [name] or his office  [phone]) if you have:  Temperature greater than 100.4,  Persistent nausea and vomiting,  Severe uncontrolled pain,  Redness, tenderness, or signs of infection (pain, swelling, redness, odor or green/yellow discharge around the site),  Difficulty breathing, headache or visual disturbances,  Any other questions or concerns you may have after discharge.  In an emergency, call 911 or go to an Emergency Department at a nearby hospital.    Post Anesthesia Home Care Instructions  Activity: Get plenty of rest for the remainder of the day. A responsible adult should stay with you for 24 hours following the procedure.  For the next 24 hours, DO NOT: -Drive a car -Paediatric nurse -Drink alcoholic beverages -Take any medication unless instructed by your physician -Make any legal decisions or sign important papers.  Meals: Start with liquid foods such as gelatin or soup. Progress to regular foods as tolerated. Avoid greasy, spicy, heavy foods. If nausea and/or vomiting occur, drink only clear liquids until the nausea and/or vomiting subsides. Call your physician if vomiting continues.  Special Instructions/Symptoms: Your throat may feel dry or sore from the anesthesia or the breathing tube placed in your throat during surgery. If this causes discomfort, gargle with warm salt water. The discomfort should disappear  within 24 hours.  If you had a scopolamine patch placed behind your ear for the management of post- operative nausea and/or vomiting:  1. The medication in the patch is effective for 72 hours, after which it should be removed.  Wrap patch in a tissue and discard in the trash. Wash hands thoroughly with soap and water. 2. You may remove the patch earlier than 72 hours if you experience unpleasant side effects which may include dry mouth, dizziness or visual disturbances. 3. Avoid touching the patch. Wash your hands with soap and water after contact with the patch.

## 2015-05-13 NOTE — Anesthesia Procedure Notes (Addendum)
Anesthesia Regional Block:  Pectoralis block  Pre-Anesthetic Checklist: ,, timeout performed, Correct Patient, Correct Site, Correct Laterality, Correct Procedure, Correct Position, site marked, Risks and benefits discussed,  Surgical consent,  Pre-op evaluation,  At surgeon's request and post-op pain management  Laterality: Right  Prep: chloraprep       Needles:   Needle Type: Echogenic Needle     Needle Length: 10cm 10 cm Needle Gauge: 21 and 21 G    Additional Needles:  Procedures: ultrasound guided (picture in chart) Pectoralis block Narrative:  Start time: 05/13/2015 10:10 AM End time: 05/13/2015 10:20 AM Injection made incrementally with aspirations every 5 mL.  Performed by: Personally  Anesthesiologist: Suzette Battiest  Additional Notes: Risks and benefits discussed. Pt tolerated well with no immediate complications.   Procedure Name: LMA Insertion Date/Time: 05/13/2015 10:47 AM Performed by: Melynda Ripple D Pre-anesthesia Checklist: Patient identified, Emergency Drugs available, Suction available and Patient being monitored Patient Re-evaluated:Patient Re-evaluated prior to inductionOxygen Delivery Method: Circle System Utilized Preoxygenation: Pre-oxygenation with 100% oxygen Intubation Type: IV induction Ventilation: Mask ventilation without difficulty LMA: LMA inserted LMA Size: 4.0 Number of attempts: 1 Airway Equipment and Method: Bite block Placement Confirmation: positive ETCO2 and breath sounds checked- equal and bilateral Tube secured with: Tape Dental Injury: Teeth and Oropharynx as per pre-operative assessment     Procedure Name: LMA Insertion Date/Time: 05/13/2015 10:47 AM Performed by: Melynda Ripple D Pre-anesthesia Checklist: Patient identified, Emergency Drugs available, Suction available and Patient being monitored Patient Re-evaluated:Patient Re-evaluated prior to inductionOxygen Delivery Method: Circle System Utilized Preoxygenation:  Pre-oxygenation with 100% oxygen Intubation Type: IV induction Ventilation: Mask ventilation without difficulty LMA: LMA inserted LMA Size: 4.0 Number of attempts: 1 Airway Equipment and Method: Bite block Placement Confirmation: positive ETCO2 Tube secured with: Tape Dental Injury: Teeth and Oropharynx as per pre-operative assessment

## 2015-05-13 NOTE — Transfer of Care (Signed)
Immediate Anesthesia Transfer of Care Note  Patient: Olivia Mora  Procedure(s) Performed: Procedure(s):  RIGHT BREAST LUMPECTOMY WITH RADIOACTIVE SEED AND RIGHT SENTINEL LYMPH NODE BIOPSY (Right) LEFT BREAST LUMPECTOMY WITH RADIOACTIVE SEED LOCALIZATION (Left)  Patient Location: PACU  Anesthesia Type:GA combined with regional for post-op pain  Level of Consciousness: sedated  Airway & Oxygen Therapy: Patient Spontanous Breathing and Patient connected to face mask oxygen  Post-op Assessment: Report given to RN and Post -op Vital signs reviewed and stable  Post vital signs: Reviewed and stable  Last Vitals:  Filed Vitals:   05/13/15 1030  BP: 105/55  Pulse: 70  Temp:   Resp: 14    Complications: No apparent anesthesia complications

## 2015-05-14 ENCOUNTER — Encounter (HOSPITAL_BASED_OUTPATIENT_CLINIC_OR_DEPARTMENT_OTHER): Payer: Self-pay | Admitting: Surgery

## 2015-05-19 ENCOUNTER — Other Ambulatory Visit (HOSPITAL_COMMUNITY): Payer: Self-pay | Admitting: *Deleted

## 2015-05-27 ENCOUNTER — Encounter (HOSPITAL_COMMUNITY): Payer: Self-pay

## 2015-06-05 ENCOUNTER — Encounter (HOSPITAL_COMMUNITY): Payer: Medicare Other | Attending: Hematology & Oncology | Admitting: Hematology & Oncology

## 2015-06-05 ENCOUNTER — Encounter (HOSPITAL_COMMUNITY): Payer: Self-pay | Admitting: Hematology & Oncology

## 2015-06-05 VITALS — BP 129/67 | HR 70 | Temp 97.9°F | Resp 18 | Wt 165.0 lb

## 2015-06-05 DIAGNOSIS — Z170 Estrogen receptor positive status [ER+]: Secondary | ICD-10-CM | POA: Diagnosis not present

## 2015-06-05 DIAGNOSIS — D242 Benign neoplasm of left breast: Secondary | ICD-10-CM

## 2015-06-05 DIAGNOSIS — C50511 Malignant neoplasm of lower-outer quadrant of right female breast: Secondary | ICD-10-CM

## 2015-06-05 NOTE — Progress Notes (Signed)
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 David Newman R breast with invasive grade 1 ductal carcinoma spanning 0.5 cm ER+ PR+ HER 2 neu -, pT1apN0 L breast negative for malignancy or atypia ONCOTYPE unable to be performed  HISTORY OF PRESENTING ILLNESS:  Olivia Mora 69 y.o. female is here for follow-up of Right breast invasive ductal carcinoma. She originally presented with complaints of nipple discharge. Her last mammogram prior to this presentation was in July of last year.   She saw Dr. Eure in consultation. She underwent a mammogram in May of this year and a ductogram was recommended. The ductogram was attempted on 01/08/2015 but was unable to be performed. Consultation with a breast surgeon was recommended.   She saw Dr. Newman and a breast MRI was ordered. In the right breast at 8:00 there was an enhancing nodule with associated dilated duct filled with sanguinous material. In the left breast at the 9:00 position there was a progressively enhancing nodule, and at the 4:00 position enhancement with a nonspecific appearance.   Right breast biopsy 04/02/2015: 8:00: IDC with extracellular mucin, ER 100%, PR 50%, Ki-67 20%, grade 1 HER-2 negative ratio 1.11, T1b N0 stage I a clinical stage; left breast biopsy 4:00: Intraductal papilloma with UDH and fibrocystic changes.  Olivia Mora is here alone today.  She states that she did  pretty good with her surgery; some of it was painful. Olivia Mora comments that she was hoping she wouldn't have to take radiation, and asks how many radiations she'll receive. She would prefer to go to [facility] for radiation. Since she has met Dr. Wentworth before, Olivia Mora would prefer for her to be her examining doctor. She states: "I really like her."  Olivia Mora remarks that things at home have been "kinda steady," that nothing worse has happened with her husband regarding his strokes. She comments that they might go out and eat for Thanksgiving, and that her son wants her to do Christmas at her house this year, but she's not sure about that yet.  She confirms that she has been experiencing some pulsating nerve pain in the breast.  She states that she's moving her bowels about 3 times a week, which she does confirm as normal.  SUMMARY OF ONCOLOGIC HISTORY:   Breast cancer of lower-outer quadrant of right female breast (HCC)   03/02/2015 Initial Diagnosis Right breast biopsy 8:00: IDC with extracellular mucin, ER 100%, PR 50%, Ki-67 20%, grade 1 HER-2 negative ratio 1.11; left breast biopsy 4:00: Intraductal papilloma with UDH and fibrocystic changes   03/06/2015 Breast MRI Right breast 8:00 position: 6 mm enhancing nodule with associated dilated duct and with blood; left breast 9:00 middle depth: 4 mm nodule, 4:00: 1 cm nodule nonspecific appearance    In terms of breast cancer risk profile:  She menarched at early age of 14 and went to menopause at age 50  She had 2 pregnancy, her first child was born at age 20  She has received birth control pills for approximately 9 years.    She was exposed to hormone replacement therapy for 20 years  She has no family history of Breast/GYN/GI cancer  MEDICAL HISTORY:  Past Medical History  Diagnosis Date  . Diabetes ([place])   . Hyperlipidemia   . Anxiety   . GERD (gastroesophageal reflux disease)   . DVT (deep vein thrombosis) in pregnancy   .  Abnormal EKG   . Mitral regurgitation     mild/moderate, echo, 10/2011  . Depression   . Vertigo     SURGICAL HISTORY: Past Surgical History  Procedure Laterality Date  . Tubal ligation    . Appendectomy    . Other surgical history      jaw  . Other surgical history      cyst removal jaw  . Breast lumpectomy with radioactive seed and sentinel lymph node biopsy Right 05/13/2015    Procedure:  RIGHT BREAST LUMPECTOMY WITH RADIOACTIVE SEED AND RIGHT SENTINEL LYMPH NODE BIOPSY;  Surgeon: [name], MD;  Location: [facility];  Service: General;  Laterality: Right;  . Breast lumpectomy with radioactive seed localization Left 05/13/2015    Procedure: LEFT BREAST LUMPECTOMY WITH RADIOACTIVE SEED LOCALIZATION;  Surgeon: [name], MD;  Location: [facility];  Service: General;  Laterality: Left;    SOCIAL HISTORY: Social History   Social History  . Marital Status: Married    Spouse Name: N/A  . Number of Children: 2  . Years of Education: N/A   Occupational History  . Not on file.   Social History Main Topics  . Smoking status: Never Smoker   . Smokeless tobacco: Never Used  . Alcohol Use: No  . Drug Use: No  . Sexual Activity: Yes    Birth Control/ Protection: Post-menopausal   Other Topics Concern  . 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  He also has a cardiac condition  2 children, 5 grandchildren Never smoked EtOH, none Previously employed for a [employer]  FAMILY HISTORY: Family History  Problem Relation Age of Onset  . Heart attack Brother 67  . CAD      Several family memebers  . Heart disease      Several family memebers    ALLERGIES:  has No Known Allergies.  MEDICATIONS:  Current Outpatient Prescriptions  Medication Sig Dispense Refill  . ALPRAZolam (XANAX) 1 MG tablet Take 1 mg  by mouth at bedtime as needed for sleep.     . clonazePAM (KLONOPIN) 1 MG tablet Take 1 mg by mouth 4 (four) times daily.    . cyanocobalamin (,VITAMIN B-12,) 1000 MCG/ML injection INJECT EVERY 4 WEEKS AS DIRECTED  6  . Cyanocobalamin (VITAMIN B-12 IJ) Inject 1 mL as directed every 30 (thirty) days.     Marland Kitchen glipiZIDE (GLUCOTROL) 10 MG tablet Take 10 mg by mouth 2 (two) times daily before a meal.    . hydrOXYzine (ATARAX/VISTARIL) 10 MG tablet Take 10 mg by mouth daily.    . insulin glargine (LANTUS) 100 UNIT/ML injection Inject 25 Units into the skin daily.     . meclizine (ANTIVERT) 50 MG tablet Take 0.5 tablets (25 mg total) by mouth 3 (three) times daily as needed for dizziness. 30 tablet 0  . metFORMIN (GLUCOPHAGE) 1000 MG tablet Take 500 mg by mouth 2 (two) times daily with a meal.     . ranitidine (ZANTAC)  150 MG tablet Take 150 mg by mouth as needed for heartburn.    . traZODone (DESYREL) 100 MG tablet Take 100 mg by mouth at bedtime.    . HYDROcodone-acetaminophen (NORCO/VICODIN) 5-325 MG tablet Take 1-2 tablets by mouth every 6 (six) hours as needed. (Patient not taking: Reported on 06/05/2015) 30 tablet 0  . PARoxetine (PAXIL) 20 MG tablet Take 20 mg by mouth daily.    . sertraline (ZOLOFT) 50 MG tablet Take 50 mg by mouth daily.    . simvastatin (ZOCOR) 40 MG tablet Take 40 mg by mouth every evening.     No current facility-administered medications for this visit.    REVIEW OF SYSTEMS:   Constitutional: Denies fevers, chills or abnormal night sweats Eyes: Denies blurriness of vision, double vision or watery eyes Ears, nose, mouth, throat, and face: Denies mucositis or sore throat Respiratory: Denies cough, dyspnea or wheezes Cardiovascular: Denies palpitation, chest discomfort or lower extremity swelling Gastrointestinal:  Denies nausea, heartburn or change in bowel habits Skin: Denies abnormal skin rashes Lymphatics: Denies new lymphadenopathy or easy  bruising Neurological:Denies numbness, tingling or new weaknesses Behavioral/Psych: Mood is stable, no new changes.  Anxiety/Nervousness Breast: bloody nipple discharge All other systems were reviewed with the patient and are negative.  14 point review of systems was performed and is negative except as detailed under history of present illness and above   PHYSICAL EXAMINATION: ECOG PERFORMANCE STATUS: 1 - Symptomatic but completely ambulatory  Filed Vitals:   06/05/15 0942  BP: 129/67  Pulse: 70  Temp: 97.9 F (36.6 C)  Resp: 18   Filed Weights   06/05/15 0942  Weight: 165 lb (74.844 kg)    GENERAL:alert, no distress and comfortable SKIN: skin color, texture, turgor are normal, no rashes or significant lesions EYES: normal, conjunctiva are pink and non-injected, sclera clear OROPHARYNX:no exudate, no erythema and lips, buccal mucosa, and tongue normal  NECK: supple, thyroid normal size, non-tender, without nodularity LYMPH:  no palpable lymphadenopathy in the cervical, axillary or inguinal LUNGS: clear to auscultation and percussion with normal breathing effort HEART: regular rate & rhythm and no murmurs and no lower extremity edema ABDOMEN:abdomen soft, non-tender and normal bowel sounds Musculoskeletal:no cyanosis of digits and no clubbing  PSYCH: alert & oriented x 3 with fluent speech NEURO: no focal motor/sensory deficits BREAST:well healing surgical sites  LABORATORY DATA:  I have reviewed the data as listed Lab Results  Component Value Date   WBC 7.1 05/11/2015   HGB 13.1 05/11/2015   HCT 39.3 05/11/2015   MCV 90.6 05/11/2015   PLT 232 05/11/2015   Lab Results  Component Value Date   NA 138 05/11/2015   K 4.4 05/11/2015   CL 102 05/11/2015   CO2 24 05/11/2015    RADIOGRAPHIC STUDIES: I have personally reviewed the radiological reports and agreed with the findings in the report.  CLINICAL DATA: Spontaneous bloody right nipple discharge noted by the  patient in clinically.  LABS: Creatinine 1.0, GFR 55.  EXAM: BILATERAL BREAST MRI WITH AND WITHOUT CONTRAST  TECHNIQUE: Multiplanar, multisequence MR images of both breasts were obtained prior to and following the intravenous administration of 14 ml of MultiHance.  THREE-DIMENSIONAL MR IMAGE RENDERING ON INDEPENDENT WORKSTATION:  Three-dimensional MR images were rendered by post-processing of the original MR data on an independent workstation. The three-dimensional MR images were interpreted, and findings are reported in the following complete MRI report for this study. Three dimensional images were evaluated at the independent DynaCad workstation    COMPARISON: Mammograms dated 03/06/2015, 12/08/2014 and previous exams.  FINDINGS: Breast composition: b. Scattered fibroglandular tissue.  Background parenchymal enhancement: Moderate.  Right breast: There is a T2 hyperintense circumscribed enhancing nodule in the right breast lower outer quadrant, middle depth, at approximately 8 o'clock, which measures 0.6 x 0.5 x 0.4 cm. It demonstrates mixed, mostly progressive, kinetics of enhancement. Dilated duct filled with T1 hyperintense, likely sanguineous fluid is seen leading from the nipple to this nodule. Other less than 5 mm scattered progressively enhancing foci in the right breast are felt to represent fibrocystic changes.  Left breast: There is a progressively enhancing T2 hyperintense circumscribed 4 mm nodule in the left 9 o'clock breast, anterior to middle depth. There is also a cluster of weak nodular enhancement in the left 4 o'clock breast, posterior depth, with progressive kinetics of enhancement measuring approximately 1.0 x 1.0 cm in axial dimensions. This finding is mostly hypointense on T2. Similar to the right breast, multiple scattered T2 hyperintense progressively enhancing less than 4 mm foci are noted throughout the left breast, thought to  represent fibrocystic changes.  Lymph nodes: No abnormal appearing lymph nodes.  Ancillary findings: None.  IMPRESSION: Right breast 8 o'clock middle depth 6 mm enhancing nodule, with associated dilated duct filled with sanguinous material, likely the cause of patient's single duct bloody nipple discharge.  Left breast 9 o'clock anterior to middle depth 4 mm progressively enhancing nodule, and left breast 4 o'clock 1 cm week nodular enhancement, with nonspecific appearance.  RECOMMENDATION: Bilateral second-look ultrasound is recommended. If second-look ultrasound is unrevealing, MRI guided core needle biopsy of the right 8 o'clock nodule, and 1 of the left breast areas of enhancement, at the discretion of the performing radiologist, is recommended.  BI-RADS CATEGORY 4: Suspicious.   Electronically Signed  By: Dobrinka Dimitrova M.D.  On: 03/06/2015 14:42    PATHOLOGY:       ASSESSMENT AND PLAN:  Breast cancer of lower-outer quadrant of right female breast (HCC) ER+ PR+ HER 2 neu -, pT1apN0, small primary at 0.5 cm L breast biopsy negative ONCOTYPE unable to be performed  Discussed with the patient, the details of pathology including the type of breast cancer,the clinical staging, the significance of ER, PR and HER-2/neu receptors and the implications for treatment. After reviewing the pathology in detail, we proceeded to discuss the different treatment options radiation, chemotherapy, antiestrogen therapies. There was not enough tissue for oncotype but based upon tumor size and available clinical data, tumor type we opted to proceed with XRT. We will discuss additional endocrine therapy at follow-up.  She wishes to be referred to [facility] to Dr. Wentworth. We will refer her today.  She will return in 6 weeks for further follow-up.   All questions were answered. The patient knows to call the clinic with any problems, questions or concerns.   This document  serves as a record of services personally performed by Shannon Penland, MD. It was created on her behalf by Katherine Galloway, a trained medical scribe. The creation of this record is based on the scribe's personal observations and the provider's statements to them. This document has been checked and approved by the attending provider.  I have reviewed the above documentation for accuracy and completeness, and I agree with the above.  This note was electronically signed.  Shannon K. Penland, MD   

## 2015-06-05 NOTE — Patient Instructions (Signed)
..  [place] at [facility] Discharge Instructions  RECOMMENDATIONS MADE BY THE CONSULTANT AND ANY TEST RESULTS WILL BE SENT TO YOUR REFERRING PHYSICIAN.  Please arrive @ [facility] Registration on December 9 @ 9:30 for CT simulation. Please go through Main Entrance (beside [facility]). Register @ [employer].    Return to see [place] on December 30 @ 9:50.  Thank you for choosing [place] at [facility] to provide your oncology and hematology care.  To afford each patient quality time with our provider, please arrive at least 15 minutes before your scheduled appointment time.    You need to re-schedule your appointment should you arrive 10 or more minutes late.  We strive to give you quality time with our providers, and arriving late affects you and other patients whose appointments are after yours.  Also, if you no show three or more times for appointments you may be dismissed from the clinic at the providers discretion.     Again, thank you for choosing [facility].  Our hope is that these requests will decrease the amount of time that you wait before being seen by our physicians.       _____________________________________________________________  Should you have questions after your visit to [facility] ([place]), please contact our office at [phone] between the hours of 8:30 a.m. and 4:30 p.m.  Voicemails left after 4:30 p.m. will not be returned until the following business day.  For prescription refill requests, have your pharmacy contact our office.

## 2015-06-19 DIAGNOSIS — Z510 Encounter for antineoplastic radiation therapy: Secondary | ICD-10-CM | POA: Diagnosis not present

## 2015-06-19 DIAGNOSIS — C50511 Malignant neoplasm of lower-outer quadrant of right female breast: Secondary | ICD-10-CM | POA: Diagnosis not present

## 2015-06-25 DIAGNOSIS — Z510 Encounter for antineoplastic radiation therapy: Secondary | ICD-10-CM | POA: Diagnosis not present

## 2015-06-25 DIAGNOSIS — C50511 Malignant neoplasm of lower-outer quadrant of right female breast: Secondary | ICD-10-CM | POA: Diagnosis not present

## 2015-06-29 DIAGNOSIS — C50511 Malignant neoplasm of lower-outer quadrant of right female breast: Secondary | ICD-10-CM | POA: Diagnosis not present

## 2015-06-29 DIAGNOSIS — Z510 Encounter for antineoplastic radiation therapy: Secondary | ICD-10-CM | POA: Diagnosis not present

## 2015-06-30 DIAGNOSIS — Z510 Encounter for antineoplastic radiation therapy: Secondary | ICD-10-CM | POA: Diagnosis not present

## 2015-06-30 DIAGNOSIS — C50511 Malignant neoplasm of lower-outer quadrant of right female breast: Secondary | ICD-10-CM | POA: Diagnosis not present

## 2015-07-01 DIAGNOSIS — Z510 Encounter for antineoplastic radiation therapy: Secondary | ICD-10-CM | POA: Diagnosis not present

## 2015-07-01 DIAGNOSIS — C50511 Malignant neoplasm of lower-outer quadrant of right female breast: Secondary | ICD-10-CM | POA: Diagnosis not present

## 2015-07-02 DIAGNOSIS — Z510 Encounter for antineoplastic radiation therapy: Secondary | ICD-10-CM | POA: Diagnosis not present

## 2015-07-02 DIAGNOSIS — C50511 Malignant neoplasm of lower-outer quadrant of right female breast: Secondary | ICD-10-CM | POA: Diagnosis not present

## 2015-07-03 DIAGNOSIS — C50511 Malignant neoplasm of lower-outer quadrant of right female breast: Secondary | ICD-10-CM | POA: Diagnosis not present

## 2015-07-03 DIAGNOSIS — Z510 Encounter for antineoplastic radiation therapy: Secondary | ICD-10-CM | POA: Diagnosis not present

## 2015-07-06 ENCOUNTER — Encounter: Payer: Self-pay | Admitting: *Deleted

## 2015-07-06 DIAGNOSIS — C50511 Malignant neoplasm of lower-outer quadrant of right female breast: Secondary | ICD-10-CM | POA: Diagnosis not present

## 2015-07-06 DIAGNOSIS — Z510 Encounter for antineoplastic radiation therapy: Secondary | ICD-10-CM | POA: Diagnosis not present

## 2015-07-07 DIAGNOSIS — Z510 Encounter for antineoplastic radiation therapy: Secondary | ICD-10-CM | POA: Diagnosis not present

## 2015-07-07 DIAGNOSIS — C50511 Malignant neoplasm of lower-outer quadrant of right female breast: Secondary | ICD-10-CM | POA: Diagnosis not present

## 2015-07-08 DIAGNOSIS — Z510 Encounter for antineoplastic radiation therapy: Secondary | ICD-10-CM | POA: Diagnosis not present

## 2015-07-08 DIAGNOSIS — C50511 Malignant neoplasm of lower-outer quadrant of right female breast: Secondary | ICD-10-CM | POA: Diagnosis not present

## 2015-07-09 DIAGNOSIS — Z510 Encounter for antineoplastic radiation therapy: Secondary | ICD-10-CM | POA: Diagnosis not present

## 2015-07-09 DIAGNOSIS — C50511 Malignant neoplasm of lower-outer quadrant of right female breast: Secondary | ICD-10-CM | POA: Diagnosis not present

## 2015-07-10 DIAGNOSIS — Z510 Encounter for antineoplastic radiation therapy: Secondary | ICD-10-CM | POA: Diagnosis not present

## 2015-07-10 DIAGNOSIS — C50511 Malignant neoplasm of lower-outer quadrant of right female breast: Secondary | ICD-10-CM | POA: Diagnosis not present

## 2015-07-14 DIAGNOSIS — Z510 Encounter for antineoplastic radiation therapy: Secondary | ICD-10-CM | POA: Diagnosis not present

## 2015-07-14 DIAGNOSIS — C50511 Malignant neoplasm of lower-outer quadrant of right female breast: Secondary | ICD-10-CM | POA: Diagnosis not present

## 2015-07-15 DIAGNOSIS — Z510 Encounter for antineoplastic radiation therapy: Secondary | ICD-10-CM | POA: Diagnosis not present

## 2015-07-15 DIAGNOSIS — C50511 Malignant neoplasm of lower-outer quadrant of right female breast: Secondary | ICD-10-CM | POA: Diagnosis not present

## 2015-07-16 DIAGNOSIS — Z510 Encounter for antineoplastic radiation therapy: Secondary | ICD-10-CM | POA: Diagnosis not present

## 2015-07-16 DIAGNOSIS — C50511 Malignant neoplasm of lower-outer quadrant of right female breast: Secondary | ICD-10-CM | POA: Diagnosis not present

## 2015-07-17 ENCOUNTER — Ambulatory Visit (HOSPITAL_COMMUNITY): Payer: Medicare Other | Admitting: Hematology & Oncology

## 2015-07-17 DIAGNOSIS — Z510 Encounter for antineoplastic radiation therapy: Secondary | ICD-10-CM | POA: Diagnosis not present

## 2015-07-17 DIAGNOSIS — C50511 Malignant neoplasm of lower-outer quadrant of right female breast: Secondary | ICD-10-CM | POA: Diagnosis not present

## 2015-07-17 NOTE — Progress Notes (Signed)
This encounter was created in error - please disregard.

## 2015-07-21 ENCOUNTER — Encounter (HOSPITAL_COMMUNITY): Payer: Self-pay

## 2015-07-21 DIAGNOSIS — Z510 Encounter for antineoplastic radiation therapy: Secondary | ICD-10-CM | POA: Diagnosis not present

## 2015-07-21 DIAGNOSIS — C50511 Malignant neoplasm of lower-outer quadrant of right female breast: Secondary | ICD-10-CM | POA: Diagnosis not present

## 2015-07-22 DIAGNOSIS — Z510 Encounter for antineoplastic radiation therapy: Secondary | ICD-10-CM | POA: Diagnosis not present

## 2015-07-22 DIAGNOSIS — C50511 Malignant neoplasm of lower-outer quadrant of right female breast: Secondary | ICD-10-CM | POA: Diagnosis not present

## 2015-07-23 DIAGNOSIS — C50511 Malignant neoplasm of lower-outer quadrant of right female breast: Secondary | ICD-10-CM | POA: Diagnosis not present

## 2015-07-23 DIAGNOSIS — Z510 Encounter for antineoplastic radiation therapy: Secondary | ICD-10-CM | POA: Diagnosis not present

## 2015-07-24 ENCOUNTER — Other Ambulatory Visit: Payer: Self-pay | Admitting: *Deleted

## 2015-07-24 DIAGNOSIS — C50511 Malignant neoplasm of lower-outer quadrant of right female breast: Secondary | ICD-10-CM

## 2015-07-26 ENCOUNTER — Telehealth: Payer: Self-pay | Admitting: Hematology and Oncology

## 2015-07-26 NOTE — Telephone Encounter (Signed)
Patient declines survivorship but will speak with dr penland about it on 1/16

## 2015-07-30 DIAGNOSIS — E119 Type 2 diabetes mellitus without complications: Secondary | ICD-10-CM | POA: Diagnosis not present

## 2015-08-03 ENCOUNTER — Ambulatory Visit (HOSPITAL_COMMUNITY): Payer: Medicare Other | Admitting: Hematology & Oncology

## 2015-08-06 DIAGNOSIS — Z853 Personal history of malignant neoplasm of breast: Secondary | ICD-10-CM | POA: Diagnosis not present

## 2015-08-06 DIAGNOSIS — Z23 Encounter for immunization: Secondary | ICD-10-CM | POA: Diagnosis not present

## 2015-08-06 DIAGNOSIS — E119 Type 2 diabetes mellitus without complications: Secondary | ICD-10-CM | POA: Diagnosis not present

## 2015-08-06 DIAGNOSIS — R42 Dizziness and giddiness: Secondary | ICD-10-CM | POA: Diagnosis not present

## 2015-08-20 ENCOUNTER — Ambulatory Visit (HOSPITAL_COMMUNITY): Payer: Medicare Other | Admitting: Hematology & Oncology

## 2015-08-21 DIAGNOSIS — Z923 Personal history of irradiation: Secondary | ICD-10-CM | POA: Diagnosis not present

## 2015-08-21 DIAGNOSIS — C50511 Malignant neoplasm of lower-outer quadrant of right female breast: Secondary | ICD-10-CM | POA: Diagnosis not present

## 2015-08-27 ENCOUNTER — Encounter (HOSPITAL_COMMUNITY): Payer: Medicare Other | Attending: Hematology & Oncology | Admitting: Hematology & Oncology

## 2015-08-27 ENCOUNTER — Encounter (HOSPITAL_COMMUNITY): Payer: Self-pay | Admitting: Hematology & Oncology

## 2015-08-27 VITALS — BP 112/65 | HR 70 | Resp 18 | Wt 160.7 lb

## 2015-08-27 DIAGNOSIS — C50511 Malignant neoplasm of lower-outer quadrant of right female breast: Secondary | ICD-10-CM | POA: Diagnosis not present

## 2015-08-27 MED ORDER — ANASTROZOLE 1 MG PO TABS: 1.0000 mg | ORAL_TABLET | Freq: Every day | ORAL | Status: DC

## 2015-08-27 NOTE — Patient Instructions (Addendum)
[place] at [facility] Discharge Instructions  RECOMMENDATIONS MADE BY THE CONSULTANT AND ANY TEST RESULTS WILL BE SENT TO YOUR REFERRING PHYSICIAN.    Exam and discussion by Dr Whitney Muse today Bone density ordered and scheduled, looks at your bones Some woman can have joint discomfort on them, it can help if you take 1200 mg of calcium daily and 2000 units of vitamin D daily  Arimidex 1 mg daily, sent to [facility] (you will take this for 5 years) Please let us know if you are having any problem on this medication because there are other medications that we can try you on Return to see the doctor in 1 month with lab work Please call the clinic if you have any questions or concerns     Thank you for choosing [place] at [facility] to provide your oncology and hematology care.  To afford each patient quality time with our provider, please arrive at least 15 minutes before your scheduled appointment time.   Beginning January 23rd 2017 lab work for the [facility] will be done in the  Main lab at [facility] on 1st floor. If you have a lab appointment with the [facility] please come in thru the  Main Entrance and check in at the main information desk  You need to re-schedule your appointment should you arrive 10 or more minutes late.  We strive to give you quality time with our providers, and arriving late affects you and other patients whose appointments are after yours.  Also, if you no show three or more times for appointments you may be dismissed from the clinic at the providers discretion.     Again, thank you for choosing [facility].  Our hope is that these requests will decrease the amount of time that you wait before being seen by our physicians.       _____________________________________________________________  Should you have questions after your visit to [facility], please contact our office at  [phone] between the hours of 8:30 a.m. and 4:30 p.m.  Voicemails left after 4:30 p.m. will not be returned until the following business day.  For prescription refill requests, have your pharmacy contact our office.         Anastrozole tablets What is this medicine? ANASTROZOLE (an AS troe zole) is used to treat breast cancer in women who have gone through menopause. Some types of breast cancer depend on estrogen to grow, and this medicine can stop tumor growth by blocking estrogen production. This medicine may be used for other purposes; ask your health care provider or pharmacist if you have questions. What should I tell my health care provider before I take this medicine? They need to know if you have any of these conditions: -liver disease -an unusual or allergic reaction to anastrozole, other medicines, foods, dyes, or preservatives -pregnant or trying to get pregnant -breast-feeding How should I use this medicine? Take this medicine by mouth with a glass of water. Follow the directions on the prescription label. You can take this medicine with or without food. Take your doses at regular intervals. Do not take your medicine more often than directed. Do not stop taking except on the advice of your doctor or health care professional. Talk to your pediatrician regarding the use of this medicine in children. Special care may be needed. Overdosage: If you think you have taken too much of this medicine contact  a poison control center or emergency room at once. NOTE: This medicine is only for you. Do not share this medicine with others. What if I miss a dose? If you miss a dose, take it as soon as you can. If it is almost time for your next dose, take only that dose. Do not take double or extra doses. What may interact with this medicine? Do not take this medicine with any of the following medications: -female hormones, like estrogens or progestins and birth control pills This medicine  may also interact with the following medications: -tamoxifen This list may not describe all possible interactions. Give your health care provider a list of all the medicines, herbs, non-prescription drugs, or dietary supplements you use. Also tell them if you smoke, drink alcohol, or use illegal drugs. Some items may interact with your medicine. What should I watch for while using this medicine? Visit your doctor or health care professional for regular checks on your progress. Let your doctor or health care professional know about any unusual vaginal bleeding. Do not treat yourself for diarrhea, nausea, vomiting or other side effects. Ask your doctor or health care professional for advice. What side effects may I notice from receiving this medicine? Side effects that you should report to your doctor or health care professional as soon as possible: -allergic reactions like skin rash, itching or hives, swelling of the face, lips, or tongue -any new or unusual symptoms -breathing problems -chest pain -leg pain or swelling -vomiting Side effects that usually do not require medical attention (report to your doctor or health care professional if they continue or are bothersome): -back or bone pain -cough, or throat infection -diarrhea or constipation -dizziness -headache -hot flashes -loss of appetite -nausea -sweating -weakness and tiredness -weight gain This list may not describe all possible side effects. Call your doctor for medical advice about side effects. You may report side effects to FDA at 1-800-FDA-1088. Where should I keep my medicine? Keep out of the reach of children. Store at room temperature between 20 and 25 degrees C (68 and 77 degrees F). Throw away any unused medicine after the expiration date. NOTE: This sheet is a summary. It may not cover all possible information. If you have questions about this medicine, talk to your doctor, pharmacist, or health care provider.     2016, Elsevier/Gold Standard. (2007-09-14 16:31:52)

## 2015-08-27 NOTE — Progress Notes (Signed)
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 Alphonsa Mora R breast with invasive grade 1 ductal carcinoma spanning 0.5 cm ER+ PR+ HER 2 neu -, pT1apN0 L breast negative for malignancy or atypia ONCOTYPE unable to be performed  HISTORY OF PRESENTING ILLNESS:  Olivia Mora 70 y.o. female is here for follow-up of Right breast invasive ductal carcinoma. She originally presented with complaints of nipple discharge.   She saw Dr. Elonda Mora in consultation. She underwent a mammogram in May of this year and a ductogram was recommended. The ductogram was attempted on 01/08/2015 but was unable to be performed. Consultation with a breast surgeon was recommended.   She saw Dr. Lucia Mora and a breast MRI was ordered. In the right breast at 8:00 there was an enhancing nodule with associated dilated duct filled with sanguinous material. In the left breast at the 9:00 position there was a progressively enhancing nodule, and at the 4:00 position enhancement with a nonspecific appearance.   Right breast biopsy 04/02/2015: 8:00: IDC with extracellular mucin, ER 100%, PR 50%, Ki-67 20%, grade 1 HER-2 negative ratio 1.11, T1b N0 stage I a clinical stage; left breast biopsy 4:00: Intraductal papilloma with UDH and fibrocystic changes.  Olivia Mora is here alone. I reviewed and discussed our plans moving forward with treatment including endocrine therapy.. The patient  is nervous, stating that a friend of hers had a lot of issues when taking these types of medications. She is not sure if they are the same medicine. She does not currently take Vitamin D or calcium supplements.  Olivia Mora is not aware of a follow up appointment with Dr. Pablo Mora.   She takes care of her husband who has suffered several strokes.  States that she thinks that sh had a bone density several years ago, going on to say that she has several discs that are messed up in her back.  Reports a hard place on her left breast that she is concerned about.   States, "I am just glad to be done with this".    SUMMARY OF ONCOLOGIC HISTORY:   Breast cancer of lower-outer quadrant of right female breast (Olivia Mora)   03/02/2015 Initial Diagnosis Right breast biopsy 8:00: IDC with extracellular mucin, ER 100%, PR 50%, Ki-67 20%, grade 1 HER-2 negative ratio 1.11; left breast biopsy 4:00: Intraductal papilloma with UDH and fibrocystic changes   03/06/2015 Breast MRI Right breast 8:00 position: 6 mm enhancing nodule with associated dilated duct and with blood; left breast 9:00 middle depth: 4 mm nodule, 4:00: 1 cm nodule nonspecific appearance   06/30/2015 - 07/23/2015 Radiation Therapy 42.72 Gy at 2.67 Gy per fraction x 16 fractions by Dr. Pablo Mora in [place].    In terms of breast cancer risk profile:  She menarched at early age of 25 and went to menopause at age 69  She had 2 pregnancy, her first child was born at age 54  She has received birth control pills for approximately 9 years.  She was exposed to hormone replacement therapy for 20 years  She has no family history of Breast/GYN/GI cancer  MEDICAL HISTORY:  Past Medical History  Diagnosis Date  . Diabetes ([place])   . Hyperlipidemia   . Anxiety   . GERD (gastroesophageal reflux disease)   . DVT (deep vein thrombosis) in pregnancy   . Abnormal EKG   . Mitral regurgitation     mild/moderate, echo, 10/2011  . Depression   . Vertigo      SURGICAL HISTORY: Past Surgical History  Procedure Laterality Date  . Tubal ligation    . Appendectomy    . Other surgical history      jaw  . Other surgical history      cyst removal jaw  . Breast lumpectomy with radioactive seed and sentinel lymph node biopsy Right 05/13/2015    Procedure:  RIGHT BREAST LUMPECTOMY WITH RADIOACTIVE SEED AND RIGHT SENTINEL LYMPH NODE BIOPSY;  Surgeon: Olivia Overall, MD;  Location: [facility];  Service: General;  Laterality: Right;  . Breast lumpectomy with radioactive seed localization Left 05/13/2015    Procedure: LEFT BREAST LUMPECTOMY WITH RADIOACTIVE SEED LOCALIZATION;  Surgeon: Olivia Overall, MD;  Location: [facility];  Service: General;  Laterality: Left;    SOCIAL HISTORY: Social History   Social History  . Marital Status: Married    Spouse Name: N/A  . Number of Children: 2  . Years of Education: N/A   Occupational History  . Not on file.   Social History Main Topics  . Smoking status: Never Smoker   . Smokeless tobacco: Never Used  . Alcohol Use: No  . Drug Use: No  . Sexual Activity: Yes    Birth Control/ Protection: Post-menopausal   Other Topics Concern  . 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  He also has a cardiac condition  2 children, 5 grandchildren Never smoked EtOH, none Previously employed for a [employer]  FAMILY HISTORY: Family History  Problem Relation Age of Onset  . Heart attack Brother 40  . CAD      Several family memebers  . Heart disease      Several family memebers    ALLERGIES:  has No Known Allergies.  MEDICATIONS:  Current Outpatient Prescriptions  Medication Sig Dispense Refill  . ALPRAZolam (XANAX) 1 MG tablet Take 1 mg by mouth at bedtime as needed for sleep.     Marland Kitchen BOOSTRIX 5-2.5-18.5 injection     . clonazePAM  (KLONOPIN) 1 MG tablet Take 1 mg by mouth 4 (four) times daily.    . cyanocobalamin (,VITAMIN B-12,) 1000 MCG/ML injection INJECT EVERY 4 WEEKS AS DIRECTED  6  . Cyanocobalamin (VITAMIN B-12 IJ) Inject 1 mL as directed every 30 (thirty) days.     Marland Kitchen glipiZIDE (GLUCOTROL) 10 MG tablet Take 10 mg by mouth 2 (two) times daily before a meal.    . HYDROcodone-acetaminophen (NORCO/VICODIN) 5-325 MG tablet Take 1-2 tablets by mouth every 6 (six) hours as needed. (Patient not taking: Reported on 06/05/2015) 30 tablet 0  . hydrOXYzine (ATARAX/VISTARIL) 10 MG tablet Take 10 mg by mouth daily.    . insulin glargine (LANTUS) 100 UNIT/ML injection Inject 25 Units into the skin daily.     . meclizine (ANTIVERT) 50 MG tablet Take 0.5 tablets (25 mg total) by mouth 3 (three) times daily as needed for dizziness. 30 tablet 0  .  metFORMIN (GLUCOPHAGE) 1000 MG tablet Take 500 mg by mouth 2 (two) times daily with a meal.     . PARoxetine (PAXIL) 20 MG tablet Take 20 mg by mouth daily.    . ranitidine (ZANTAC) 150 MG tablet Take 150 mg by mouth as needed for heartburn.    . sertraline (ZOLOFT) 50 MG tablet Take 50 mg by mouth daily.    . simvastatin (ZOCOR) 40 MG tablet Take 40 mg by mouth every evening.    . traZODone (DESYREL) 100 MG tablet Take 100 mg by mouth at bedtime.     No current facility-administered medications for this visit.    REVIEW OF SYSTEMS:   Constitutional: Denies fevers, chills or abnormal night sweats Eyes: Denies blurriness of vision, double vision or watery eyes Ears, nose, mouth, throat, and face: Denies mucositis or sore throat Respiratory: Denies cough, dyspnea or wheezes Cardiovascular: Denies palpitation, chest discomfort or lower extremity swelling Gastrointestinal:  Denies nausea, heartburn or change in bowel habits Skin: Denies abnormal skin rashes Hard area felt on left breast Lymphatics: Denies new lymphadenopathy or easy bruising Neurological:Denies numbness, tingling or new  weaknesses Behavioral/Psych: Mood is stable, no new changes.  Anxiety/Nervousness Breast: bloody nipple discharge All other systems were reviewed with the patient and are negative.  14 point review of systems was performed and is negative except as detailed under history of present illness and above  PHYSICAL EXAMINATION: ECOG PERFORMANCE STATUS: 1 - Symptomatic but completely ambulatory  Filed Vitals:   08/27/15 0851  BP: 112/65  Pulse: 70  Resp: 18   Filed Weights   08/27/15 0851  Weight: 160 lb 11.2 oz (72.893 kg)   GENERAL:alert, no distress and comfortable SKIN: skin color, texture, turgor are normal, no rashes or significant lesions EYES: normal, conjunctiva are pink and non-injected, sclera clear OROPHARYNX:no exudate, no erythema and lips, buccal mucosa, and tongue normal  NECK: supple, thyroid normal size, non-tender, without nodularity LYMPH:  no palpable lymphadenopathy in the cervical, axillary or inguinal LUNGS: clear to auscultation and percussion with normal breathing effort HEART: regular rate & rhythm and no murmurs and no lower extremity edema ABDOMEN:abdomen soft, non-tender and normal bowel sounds Musculoskeletal:no cyanosis of digits and no clubbing  PSYCH: alert & oriented x 3 with fluent speech NEURO: no focal motor/sensory deficits BREAST:Well healing surgical sites. Palpable thickening on the left breast along prior incision site consistent with scar tissue.  LABORATORY DATA:  I have reviewed the data as listed Lab Results  Component Value Date   WBC 7.1 05/11/2015   HGB 13.1 05/11/2015   HCT 39.3 05/11/2015   MCV 90.6 05/11/2015   PLT 232 05/11/2015   Lab Results  Component Value Date   NA 138 05/11/2015   K 4.4 05/11/2015   CL 102 05/11/2015   CO2 24 05/11/2015    RADIOGRAPHIC STUDIES: I have personally reviewed the radiological reports and agreed with the findings in the report.  CLINICAL DATA: Spontaneous bloody right nipple  discharge noted by the patient in clinically.  LABS: Creatinine 1.0, GFR 55.  EXAM: BILATERAL BREAST MRI WITH AND WITHOUT CONTRAST  TECHNIQUE: Multiplanar, multisequence MR images of both breasts were obtained prior to and following the intravenous administration of 14 ml of MultiHance.  THREE-DIMENSIONAL MR IMAGE RENDERING ON INDEPENDENT WORKSTATION:  Three-dimensional MR images were rendered by post-processing of the original MR data on an independent workstation. The three-dimensional MR images were interpreted, and findings are reported in the following complete MRI report for this  study. Three dimensional images were evaluated at the independent DynaCad workstation  COMPARISON: Mammograms dated 03/06/2015, 12/08/2014 and previous exams.  FINDINGS: Breast composition: b. Scattered fibroglandular tissue.  Background parenchymal enhancement: Moderate.  Right breast: There is a T2 hyperintense circumscribed enhancing nodule in the right breast lower outer quadrant, middle depth, at approximately 8 o'clock, which measures 0.6 x 0.5 x 0.4 cm. It demonstrates mixed, mostly progressive, kinetics of enhancement. Dilated duct filled with T1 hyperintense, likely sanguineous fluid is seen leading from the nipple to this nodule. Other less than 5 mm scattered progressively enhancing foci in the right breast are felt to represent fibrocystic changes.  Left breast: There is a progressively enhancing T2 hyperintense circumscribed 4 mm nodule in the left 9 o'clock breast, anterior to middle depth. There is also a cluster of weak nodular enhancement in the left 4 o'clock breast, posterior depth, with progressive kinetics of enhancement measuring approximately 1.0 x 1.0 cm in axial dimensions. This finding is mostly hypointense on T2. Similar to the right breast, multiple scattered T2 hyperintense progressively enhancing less than 4 mm foci are noted throughout the left  breast, thought to represent fibrocystic changes.  Lymph nodes: No abnormal appearing lymph nodes.  Ancillary findings: None.  IMPRESSION: Right breast 8 o'clock middle depth 6 mm enhancing nodule, with associated dilated duct filled with sanguinous material, likely the cause of patient's single duct bloody nipple discharge.  Left breast 9 o'clock anterior to middle depth 4 mm progressively enhancing nodule, and left breast 4 o'clock 1 cm week nodular enhancement, with nonspecific appearance.  RECOMMENDATION: Bilateral second-look ultrasound is recommended. If second-look ultrasound is unrevealing, MRI guided core needle biopsy of the right 8 o'clock nodule, and 1 of the left breast areas of enhancement, at the discretion of the performing radiologist, is recommended.  BI-RADS CATEGORY 4: Suspicious.   Electronically Signed  By: Fidela Salisbury M.D.  On: 03/06/2015 14:42    PATHOLOGY:       ASSESSMENT AND PLAN:  Breast cancer of lower-outer quadrant of right female breast ([place]) ER+ PR+ HER 2 neu -, pT1apN0, small primary at 0.5 cm L breast biopsy negative ONCOTYPE unable to be performed Adjuvant XRT    I examined and explained that the palpable area the patient felt on her left breast is consistent with scar tissue.   I have set up the patient for a bone density.  Ms. Mander will begin taking Arimidex. I answered all questions the patient had regarding this medication. The patient was given a care sheet explaining how to take Vitamin D and calcium supplements. We discussed the risks and benefits of anti-estrogen therapy with aromatase inhibitors. These include but not limited to insomnia, hot flashes, mood changes, vaginal dryness, bone density loss, and weight gain. Although rare, serious side effects including endometrial cancer, risk of blood clots were also discussed. We strongly believe that the benefits far outweigh the risks. Patient  understands these risks and consented to starting treatment. Planned treatment duration is 5 years.  She will return in 1 month.  Orders Placed This Encounter  Procedures  . DG Bone Density    Standing Status: Future     Number of Occurrences:      Standing Expiration Date: 08/26/2016    Order Specific Question:  Reason for Exam (SYMPTOM  OR DIAGNOSIS REQUIRED)    Answer:  high risk medication    Order Specific Question:  Preferred imaging location?    Answer:  [facility]  with Differential    Standing Status: Future     Number of Occurrences:      Standing Expiration Date: 08/26/2016  . Comprehensive metabolic panel    Standing Status: Future     Number of Occurrences:      Standing Expiration Date: 08/26/2016  . Vitamin D 25 hydroxy    Standing Status: Future     Number of Occurrences:      Standing Expiration Date: 08/26/2016    All questions were answered. The patient knows to call the clinic with any problems, questions or concerns.   This document serves as a record of services personally performed by Ancil Linsey, MD. It was created on her behalf by Arlyce Harman, a trained medical scribe. The creation of this record is based on the scribe's personal observations and the provider's statements to them. This document has been checked and approved by the attending provider.  I have reviewed the above documentation for accuracy and completeness, and I agree with the above.  This note was electronically signed.  Kelby Fam. Whitney Muse, MD

## 2015-09-11 ENCOUNTER — Other Ambulatory Visit (HOSPITAL_COMMUNITY): Payer: Medicare Other

## 2015-09-16 ENCOUNTER — Ambulatory Visit (HOSPITAL_COMMUNITY): Payer: Medicare Other | Admitting: Hematology & Oncology

## 2015-09-18 ENCOUNTER — Other Ambulatory Visit (HOSPITAL_COMMUNITY): Payer: Medicare Other

## 2015-09-19 NOTE — Progress Notes (Signed)
This encounter was created in error - please disregard.

## 2015-09-23 ENCOUNTER — Ambulatory Visit (HOSPITAL_COMMUNITY)
Admission: RE | Admit: 2015-09-23 | Discharge: 2015-09-23 | Disposition: A | Discharge status: Home / Self Care | Payer: Medicare Other | Source: Ambulatory Visit | Attending: Hematology & Oncology | Admitting: Hematology & Oncology

## 2015-09-23 ENCOUNTER — Inpatient Hospital Stay (HOSPITAL_COMMUNITY): Admission: RE | Admit: 2015-09-23 | Payer: Medicare Other | Source: Ambulatory Visit

## 2015-09-23 ENCOUNTER — Other Ambulatory Visit (HOSPITAL_COMMUNITY): Payer: Medicare Other

## 2015-09-23 DIAGNOSIS — C50511 Malignant neoplasm of lower-outer quadrant of right female breast: Secondary | ICD-10-CM | POA: Diagnosis not present

## 2015-09-23 DIAGNOSIS — Z780 Asymptomatic menopausal state: Secondary | ICD-10-CM | POA: Diagnosis not present

## 2015-09-25 ENCOUNTER — Encounter (HOSPITAL_COMMUNITY): Payer: Medicare Other

## 2015-09-25 ENCOUNTER — Emergency Department (HOSPITAL_COMMUNITY): Payer: Medicare Other

## 2015-09-25 ENCOUNTER — Emergency Department (HOSPITAL_COMMUNITY)
Admission: EM | Admit: 2015-09-25 | Discharge: 2015-09-25 | Disposition: A | Discharge status: Home / Self Care | Payer: Medicare Other | Attending: Emergency Medicine | Admitting: Emergency Medicine

## 2015-09-25 ENCOUNTER — Encounter (HOSPITAL_COMMUNITY): Payer: Medicare Other | Attending: Hematology & Oncology | Admitting: Hematology & Oncology

## 2015-09-25 ENCOUNTER — Encounter (HOSPITAL_COMMUNITY): Payer: Self-pay | Admitting: Hematology & Oncology

## 2015-09-25 ENCOUNTER — Encounter (HOSPITAL_COMMUNITY): Payer: Self-pay | Admitting: Emergency Medicine

## 2015-09-25 VITALS — BP 116/59 | HR 55 | Temp 97.8°F | Resp 18 | Wt 163.0 lb

## 2015-09-25 DIAGNOSIS — E119 Type 2 diabetes mellitus without complications: Secondary | ICD-10-CM | POA: Insufficient documentation

## 2015-09-25 DIAGNOSIS — E785 Hyperlipidemia, unspecified: Secondary | ICD-10-CM | POA: Insufficient documentation

## 2015-09-25 DIAGNOSIS — Z79899 Other long term (current) drug therapy: Secondary | ICD-10-CM

## 2015-09-25 DIAGNOSIS — Z853 Personal history of malignant neoplasm of breast: Secondary | ICD-10-CM | POA: Insufficient documentation

## 2015-09-25 DIAGNOSIS — C50511 Malignant neoplasm of lower-outer quadrant of right female breast: Secondary | ICD-10-CM | POA: Insufficient documentation

## 2015-09-25 DIAGNOSIS — F1910 Other psychoactive substance abuse, uncomplicated: Secondary | ICD-10-CM | POA: Diagnosis not present

## 2015-09-25 DIAGNOSIS — Z794 Long term (current) use of insulin: Secondary | ICD-10-CM | POA: Diagnosis not present

## 2015-09-25 DIAGNOSIS — R4701 Aphasia: Secondary | ICD-10-CM | POA: Diagnosis not present

## 2015-09-25 DIAGNOSIS — F329 Major depressive disorder, single episode, unspecified: Secondary | ICD-10-CM | POA: Diagnosis not present

## 2015-09-25 DIAGNOSIS — I69328 Other speech and language deficits following cerebral infarction: Secondary | ICD-10-CM | POA: Diagnosis not present

## 2015-09-25 DIAGNOSIS — I69320 Aphasia following cerebral infarction: Secondary | ICD-10-CM | POA: Diagnosis present

## 2015-09-25 DIAGNOSIS — R410 Disorientation, unspecified: Secondary | ICD-10-CM | POA: Diagnosis not present

## 2015-09-25 DIAGNOSIS — Z7984 Long term (current) use of oral hypoglycemic drugs: Secondary | ICD-10-CM | POA: Diagnosis not present

## 2015-09-25 DIAGNOSIS — R479 Unspecified speech disturbances: Secondary | ICD-10-CM

## 2015-09-25 DIAGNOSIS — R739 Hyperglycemia, unspecified: Secondary | ICD-10-CM | POA: Diagnosis not present

## 2015-09-25 LAB — CBC WITH DIFFERENTIAL/PLATELET
Basophils Absolute: 0 10*3/uL (ref 0.0–0.1)
Basophils Relative: 0 %
EOS PCT: 1 %
Eosinophils Absolute: 0.1 10*3/uL (ref 0.0–0.7)
HEMATOCRIT: 37.6 % (ref 36.0–46.0)
Hemoglobin: 12.8 g/dL (ref 12.0–15.0)
LYMPHS ABS: 1.4 10*3/uL (ref 0.7–4.0)
LYMPHS PCT: 29 %
MCH: 30 pg (ref 26.0–34.0)
MCHC: 34 g/dL (ref 30.0–36.0)
MCV: 88.3 fL (ref 78.0–100.0)
MONO ABS: 0.4 10*3/uL (ref 0.1–1.0)
MONOS PCT: 7 %
Neutro Abs: 3.2 10*3/uL (ref 1.7–7.7)
Neutrophils Relative %: 63 %
PLATELETS: 209 10*3/uL (ref 150–400)
RBC: 4.26 MIL/uL (ref 3.87–5.11)
RDW: 13.2 % (ref 11.5–15.5)
WBC: 5 10*3/uL (ref 4.0–10.5)

## 2015-09-25 LAB — COMPREHENSIVE METABOLIC PANEL
ALT: 18 U/L (ref 14–54)
AST: 18 U/L (ref 15–41)
Albumin: 3.7 g/dL (ref 3.5–5.0)
Alkaline Phosphatase: 68 U/L (ref 38–126)
Anion gap: 8 (ref 5–15)
BILIRUBIN TOTAL: 0.4 mg/dL (ref 0.3–1.2)
BUN: 17 mg/dL (ref 6–20)
CHLORIDE: 102 mmol/L (ref 101–111)
CO2: 26 mmol/L (ref 22–32)
CREATININE: 0.92 mg/dL (ref 0.44–1.00)
Calcium: 9.3 mg/dL (ref 8.9–10.3)
Glucose, Bld: 460 mg/dL — ABNORMAL HIGH (ref 65–99)
POTASSIUM: 4.5 mmol/L (ref 3.5–5.1)
Sodium: 136 mmol/L (ref 135–145)
TOTAL PROTEIN: 6.1 g/dL — AB (ref 6.5–8.1)

## 2015-09-25 LAB — CBG MONITORING, ED: GLUCOSE-CAPILLARY: 270 mg/dL — AB (ref 65–99)

## 2015-09-25 LAB — URINALYSIS, ROUTINE W REFLEX MICROSCOPIC
BILIRUBIN URINE: NEGATIVE
GLUCOSE, UA: 100 mg/dL — AB
HGB URINE DIPSTICK: NEGATIVE
KETONES UR: NEGATIVE mg/dL
Leukocytes, UA: NEGATIVE
Nitrite: NEGATIVE
PH: 5.5 (ref 5.0–8.0)
Protein, ur: NEGATIVE mg/dL
Specific Gravity, Urine: 1.01 (ref 1.005–1.030)

## 2015-09-25 MED ORDER — GADOBENATE DIMEGLUMINE 529 MG/ML IV SOLN
15.0000 mL | Freq: Once | INTRAVENOUS | Status: AC | PRN
  Administered 2015-09-25: 15 mL via INTRAVENOUS

## 2015-09-25 MED ORDER — LORAZEPAM 2 MG/ML IJ SOLN
1.0000 mg | Freq: Once | INTRAMUSCULAR | Status: AC
  Administered 2015-09-25: 1 mg via INTRAVENOUS
  Filled 2015-09-25: qty 1

## 2015-09-25 NOTE — ED Notes (Signed)
Pt is strong bilateral, walking short steps, and off balance with eyes closed, pt state this in not new.

## 2015-09-25 NOTE — Progress Notes (Signed)
Pt very adimit about leaving to go home to her husband.  Pt states that there is no phone and that we could not get in touch with him.  She is his caregiver.  She knows that we recommend going to the ER not home first.

## 2015-09-25 NOTE — ED Notes (Signed)
Patient given discharge instruction, verbalized understand. IV removed, band aid applied. Patient ambulatory out of the department.  

## 2015-09-25 NOTE — Progress Notes (Signed)
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 [name] R breast with invasive grade 1 ductal carcinoma spanning 0.5 cm ER+ PR+ HER 2 neu -, pT1apN0 L breast negative for malignancy or atypia ONCOTYPE unable to be performed  HISTORY OF PRESENTING ILLNESS:  Olivia Mora 70 y.o. female is here for follow-up of Right breast invasive ductal carcinoma. She originally presented with complaints of nipple discharge.   She saw Dr. Elonda Husky in consultation. She underwent a mammogram in May of this year and a ductogram was recommended. The ductogram was attempted on 01/08/2015 but was unable to be performed. Consultation with a breast surgeon was recommended.   She saw Dr. [name] and a breast MRI was ordered. In the right breast at 8:00 there was an enhancing nodule with associated dilated duct filled with sanguinous material. In the left breast at the 9:00 position there was a progressively enhancing nodule, and at the 4:00 position enhancement with a nonspecific appearance.   Right breast biopsy 04/02/2015: 8:00: IDC with extracellular mucin, ER 100%, PR 50%, Ki-67 20%, grade 1 HER-2 negative ratio 1.11, T1b N0 stage I a clinical stage; left breast biopsy 4:00: Intraductal papilloma with UDH and fibrocystic changes.  Olivia Mora returns to the [facility] alone today.  Her blood sugar today is 463. Olivia Mora says that it seems like this happens  whenever she starts new medicine. Her PCP is Dr. Willey Blade. She notes that he cut her metformin down to 1000 total, where she used to be taking 2000 total. She says that she had to have her dose lowered since she was running to and from the bathroom with diarrhea. She notes that he also upped her Lantis, and that she's been trying to get her blood sugar down, but nothing has worked. She mentions that her blood sugar has been close to 400 for several days.    In addition to this, she mentions another problem. She says she's had vertigo for two years, which makes her dizzy, and has even made her throw up one time. She went to the ENT for a checkup, but he couldn't find anything wrong. She adds that she's forgetful lately and that "something needs to be done."  She notes confusion and thinks she is more confused today.  She notes a prior MRI of the brain which showed an old stroke. (10/15/2014)  Today she repeats that she's forgetting things, and, when asked, admits that she's having a hard time communicating today.  She remarks that she's felt "weird in her head" for the past two years, with the onset of her vertigo and confusion. She says it seems like when she gets nervous or confused, "my head feels funny."    SUMMARY OF ONCOLOGIC HISTORY:   Breast cancer of lower-outer quadrant of right female breast ([place])   03/02/2015 Initial Diagnosis Right breast biopsy 8:00: IDC with extracellular mucin, ER 100%, PR 50%, Ki-67 20%, grade 1 HER-2 negative ratio 1.11; left breast biopsy 4:00: Intraductal papilloma with UDH and fibrocystic changes   03/06/2015 Breast  MRI Right breast 8:00 position: 6 mm enhancing nodule with associated dilated duct and with blood; left breast 9:00 middle depth: 4 mm nodule, 4:00: 1 cm nodule nonspecific appearance   06/30/2015 - 07/23/2015 Radiation Therapy 42.72 Gy at 2.67 Gy per fraction x 16 fractions by Dr. [name] in [place].    In terms of breast cancer risk profile:  She  menarched at early age of 50 and went to menopause at age 75  She had 2 pregnancy, her first child was born at age 33  She has received birth control pills for approximately 9 years.  She was exposed to hormone replacement therapy for 20 years  She has no family history of Breast/GYN/GI cancer  MEDICAL HISTORY:  Past Medical History  Diagnosis Date  . Diabetes ([place])   . Hyperlipidemia   . Anxiety   . GERD (gastroesophageal reflux disease)   . DVT (deep vein thrombosis) in pregnancy   . Abnormal EKG   . Mitral regurgitation     mild/moderate, echo, 10/2011  . Depression   . Vertigo     SURGICAL HISTORY: Past Surgical History  Procedure Laterality Date  . Tubal ligation    . Appendectomy    . Other surgical history      jaw  . Other surgical history      cyst removal jaw  . Breast lumpectomy with radioactive seed and sentinel lymph node biopsy Right 05/13/2015    Procedure:  RIGHT BREAST LUMPECTOMY WITH RADIOACTIVE SEED AND RIGHT SENTINEL LYMPH NODE BIOPSY;  Surgeon: [name], MD;  Location: [facility];  Service: General;  Laterality: Right;  . Breast lumpectomy with radioactive seed localization Left 05/13/2015    Procedure: LEFT BREAST LUMPECTOMY WITH RADIOACTIVE SEED LOCALIZATION;  Surgeon: [name], MD;  Location: [facility];  Service: General;  Laterality: Left;    SOCIAL HISTORY: Social History   Social History  . Marital Status: Married    Spouse Name: N/A  . Number of Children: 2  . Years of Education: N/A   Occupational History  . Not on file.   Social History Main Topics  . Smoking status: Never Smoker   . Smokeless tobacco: Never Used  . Alcohol Use: No  . Drug Use: No  . Sexual Activity: Yes    Birth Control/ Protection: Post-menopausal   Other Topics Concern  . 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  He also has a cardiac condition  2 children, 5 grandchildren Never smoked EtOH, none Previously employed for a [employer]  FAMILY HISTORY: Family History  Problem Relation Age of Onset  . Heart attack Brother 33  . CAD      Several family memebers  . Heart disease      Several family memebers    ALLERGIES:  has No Known Allergies.  MEDICATIONS:  Current Outpatient Prescriptions  Medication Sig Dispense Refill  . ALPRAZolam (XANAX) 1 MG tablet Take 1 mg by mouth at bedtime as needed for sleep.     Marland Kitchen anastrozole (ARIMIDEX) 1 MG tablet Take 1 tablet (1 mg total) by mouth daily. 30 tablet 3  . BOOSTRIX 5-2.5-18.5 injection     . clonazePAM (KLONOPIN) 1 MG tablet Take 1 mg by mouth 4 (four) times daily.    . cyanocobalamin (,VITAMIN B-12,) 1000 MCG/ML injection INJECT EVERY 4  WEEKS AS DIRECTED  6  . Cyanocobalamin (VITAMIN B-12 IJ) Inject 1 mL as directed every 30 (thirty) days.     Marland Kitchen glipiZIDE (GLUCOTROL) 10 MG tablet Take 10 mg by mouth 2 (two) times daily before a meal.    . hydrOXYzine (ATARAX/VISTARIL) 10 MG tablet Take 10 mg by mouth daily.    . insulin glargine (LANTUS) 100 UNIT/ML injection Inject 30 Units into the skin daily.     . meclizine (ANTIVERT) 50 MG tablet Take 0.5 tablets (25 mg total) by mouth 3 (three) times daily as needed for dizziness. 30 tablet 0  . metFORMIN (GLUCOPHAGE) 1000 MG tablet Take 500 mg by mouth daily.     Marland Kitchen PARoxetine (PAXIL) 20 MG tablet Take 20 mg by mouth daily.    . sertraline (ZOLOFT) 50 MG tablet Take 50 mg by mouth daily.    . simvastatin (ZOCOR) 40 MG tablet Take 40 mg by mouth every evening. Reported on 09/25/2015    . traZODone (DESYREL) 100 MG tablet Take 100 mg by mouth at bedtime.    Marland Kitchen HYDROcodone-acetaminophen (NORCO/VICODIN) 5-325 MG tablet Take 1-2 tablets by mouth every 6 (six) hours as needed. (Patient not taking: Reported on 06/05/2015) 30 tablet 0  . ranitidine (ZANTAC) 150 MG tablet Take 150 mg by  mouth as needed for heartburn. Reported on 09/25/2015     No current facility-administered medications for this visit.    REVIEW OF SYSTEMS:   Constitutional: Denies fevers, chills or abnormal night sweats Eyes: Denies blurriness of vision, double vision or watery eyes Ears, nose, mouth, throat, and face: Denies mucositis or sore throat Respiratory: Denies cough, dyspnea or wheezes Cardiovascular: Denies palpitation, chest discomfort or lower extremity swelling Gastrointestinal:  Denies nausea, heartburn or change in bowel habits Skin: Denies abnormal skin rashes Lymphatics: Denies new lymphadenopathy or easy bruising Neurological:Denies numbness, tingling or new weaknesses, confusion, dizziness Behavioral/Psych: Mood is stable, no new changes.  Anxiety/Nervousness Breast: bloody nipple discharge All other systems were reviewed with the patient and are negative.  14 point review of systems was performed and is negative except as detailed under history of present illness and above    PHYSICAL EXAMINATION: ECOG PERFORMANCE STATUS: 1 - Symptomatic but completely ambulatory  Filed Vitals:   09/25/15 1300  BP: 116/59  Pulse: 55  Temp: 97.8 F (36.6 C)  Resp: 18   Filed Weights   09/25/15 1300  Weight: 163 lb (73.936 kg)   GENERAL:alert, no distress and comfortable SKIN: skin color, texture, turgor are normal, no rashes or significant lesions EYES: normal, conjunctiva are pink and non-injected, sclera clear OROPHARYNX:no exudate, no erythema and lips, buccal mucosa, and tongue normal  NECK: supple, thyroid normal size, non-tender, without nodularity LYMPH:  no palpable lymphadenopathy in the cervical, axillary or inguinal LUNGS: clear to auscultation and percussion with normal breathing effort HEART: regular rate & rhythm and no murmurs and no lower extremity edema ABDOMEN:abdomen soft, non-tender and normal bowel sounds Musculoskeletal:no cyanosis of digits and no clubbing    PSYCH: alert & oriented x 3 with fluent speech NEURO: no focal motor/sensory deficits, difficulty recalling the year, slow to recall date, president. Approached exam table laterally/seemed confused as to how to get onto exam table   LABORATORY DATA:  I have reviewed the data as listed Lab Results  Component Value Date   WBC 5.0 09/25/2015   HGB 12.8 09/25/2015   HCT 37.6 09/25/2015   MCV 88.3 09/25/2015   PLT 209 09/25/2015   Lab  Results  Component Value Date   NA 136 09/25/2015   K 4.5 09/25/2015   CL 102 09/25/2015   CO2 26 09/25/2015    RADIOGRAPHIC STUDIES: I have personally reviewed the radiological reports and agreed with the findings in the report.  CLINICAL DATA: Spontaneous bloody right nipple discharge noted by the patient in clinically.  LABS: Creatinine 1.0, GFR 55.  EXAM: BILATERAL BREAST MRI WITH AND WITHOUT CONTRAST  TECHNIQUE: Multiplanar, multisequence MR images of both breasts were obtained prior to and following the intravenous administration of 14 ml of MultiHance.  THREE-DIMENSIONAL MR IMAGE RENDERING ON INDEPENDENT WORKSTATION:  Three-dimensional MR images were rendered by post-processing of the original MR data on an independent workstation. The three-dimensional MR images were interpreted, and findings are reported in the following complete MRI report for this study. Three dimensional images were evaluated at the independent DynaCad workstation  COMPARISON: Mammograms dated 03/06/2015, 12/08/2014 and previous exams.  FINDINGS: Breast composition: b. Scattered fibroglandular tissue.  Background parenchymal enhancement: Moderate.  Right breast: There is a T2 hyperintense circumscribed enhancing nodule in the right breast lower outer quadrant, middle depth, at approximately 8 o'clock, which measures 0.6 x 0.5 x 0.4 cm. It demonstrates mixed, mostly progressive, kinetics of enhancement. Dilated duct filled with T1  hyperintense, likely sanguineous fluid is seen leading from the nipple to this nodule. Other less than 5 mm scattered progressively enhancing foci in the right breast are felt to represent fibrocystic changes.  Left breast: There is a progressively enhancing T2 hyperintense circumscribed 4 mm nodule in the left 9 o'clock breast, anterior to middle depth. There is also a cluster of weak nodular enhancement in the left 4 o'clock breast, posterior depth, with progressive kinetics of enhancement measuring approximately 1.0 x 1.0 cm in axial dimensions. This finding is mostly hypointense on T2. Similar to the right breast, multiple scattered T2 hyperintense progressively enhancing less than 4 mm foci are noted throughout the left breast, thought to represent fibrocystic changes.  Lymph nodes: No abnormal appearing lymph nodes.  Ancillary findings: None.  IMPRESSION: Right breast 8 o'clock middle depth 6 mm enhancing nodule, with associated dilated duct filled with sanguinous material, likely the cause of patient's single duct bloody nipple discharge.  Left breast 9 o'clock anterior to middle depth 4 mm progressively enhancing nodule, and left breast 4 o'clock 1 cm week nodular enhancement, with nonspecific appearance.  RECOMMENDATION: Bilateral second-look ultrasound is recommended. If second-look ultrasound is unrevealing, MRI guided core needle biopsy of the right 8 o'clock nodule, and 1 of the left breast areas of enhancement, at the discretion of the performing radiologist, is recommended.  BI-RADS CATEGORY 4: Suspicious.   Electronically Signed  By: Fidela Salisbury M.D.  On: 03/06/2015 14:42    PATHOLOGY:       ASSESSMENT AND PLAN:  Breast cancer of lower-outer quadrant of right female breast ([place]) ER+ PR+ HER 2 neu -, pT1apN0, small primary at 0.5 cm L breast biopsy negative ONCOTYPE unable to be performed Adjuvant XRT  Endocrine therapy with  Arimidex DEXA 09/23/2015 normal bone density Confusion  She is clearly confused today but insists that this has occurred before. She does have a history of CVA in the past.  I have urged her to go to the ED, I have called Dr. Thurnell Garbe who is in the ED and discussed the patient with her. Her blood sugar today is also 463. The patient is adamant she has to go home first. I have strongly discouraged this but she  notes she has to drive home to her husband, unfortunately she is going to leave the clinic AMA. I have offered for her to speak with the case manager per the ED but she has declined.  For now she is to continue on arimidex. We briefly addressed her DEXA, I am going to bring her back in 6 weeks for additional discussion.   Upon departure from the clinic the patient notes she will return to the ED later this evening, if so I have again encouraged her to not drive herself.  All questions were answered. The patient knows to call the clinic with any problems, questions or concerns.   This document serves as a record of services personally performed by Ancil Linsey, MD. It was created on her behalf by Toni Amend, a trained medical scribe. The creation of this record is based on the scribe's personal observations and the provider's statements to them. This document has been checked and approved by the attending provider.  I have reviewed the above documentation for accuracy and completeness, and I agree with the above.  This note was electronically signed.  Kelby Fam. Whitney Muse, MD

## 2015-09-25 NOTE — ED Notes (Addendum)
PT states she has hx of anxiety and jittery with difficulty forming words at time but no slurr noted. Dr. Whitney Muse contacted and stated pt was told to come to ED at 1300 today d/t difficulty forming words and shaking all over and it was she was different than a month ago when she last seen the pt. PT denies any weakness and no deficits noted.  PT stated she has been at loss for words a times since her dx of vertigo a month ago.

## 2015-09-25 NOTE — Discharge Instructions (Signed)
Olivia Mora, it does not appear that you have a new speech problem based on the history your provided and that this is longstanding. It is unclear what the cause is, but it's possible it may be driven by your underlying anxiety. It's also possible some of the amnesia (memory problems) you've been experiencing is related to some of the medications your on. However, do not stop Klonopin suddenly because this can cause a seizure. You should only come off of this medication with guidance of a physician. We recommend follow up with a neurologist. Please follow up with your primary doctor.  The good news is that your MRI showed NO STROKE and NO CANCER spread to the brain.

## 2015-09-25 NOTE — ED Provider Notes (Signed)
CSN: [id number]     Arrival date & time 09/25/15  1523 History   First MD Initiated Contact with Patient 09/25/15 1627     Chief Complaint  Patient presents with  . 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 She reports she's had difficulty speaking, intermittent confusion, and memory problems  on and off for the past two years. This symptoms came on with her intermittent vertigo 2 years ago as well. However, she indicates that her symptoms have worsened after completing radiation after her breast lumpectomy. For example, when she plays [employer] with her friends, she can have difficulty remembering what to do or what to say. She indicates that this is a longstanding problem. She denies any head, new onset weakness, new changes in sensation.   Past Medical History  Diagnosis Date  . Diabetes ([place])   . Hyperlipidemia   . Anxiety   . GERD (gastroesophageal reflux disease)   . DVT (deep vein thrombosis) in pregnancy   . Abnormal EKG   . Mitral regurgitation     mild/moderate, echo, 10/2011  . Depression   . Vertigo    Past Surgical History  Procedure Laterality Date  . Tubal ligation    . Appendectomy    . Other surgical history      jaw  . Other surgical history      cyst removal jaw  . Breast lumpectomy with radioactive seed and sentinel lymph node biopsy Right 05/13/2015    Procedure:  RIGHT BREAST LUMPECTOMY WITH RADIOACTIVE SEED AND RIGHT SENTINEL LYMPH NODE BIOPSY;  Surgeon: [name], MD;  Location: [facility];  Service: General;  Laterality: Right;  . Breast lumpectomy with radioactive seed localization Left 05/13/2015    Procedure:  LEFT BREAST LUMPECTOMY WITH RADIOACTIVE SEED LOCALIZATION;  Surgeon: [name], MD;  Location: [facility];  Service: General;  Laterality: Left;   Family History  Problem Relation Age of Onset  . Heart attack Brother 48  . CAD      Several family memebers  . Heart disease      Several family memebers   Social History  Substance Use Topics  . Smoking status: Never Smoker   . Smokeless tobacco: Never Used  . Alcohol Use: No   OB History    No data available     Review of Systems  Constitutional: Positive for chills and fatigue. Negative for fever.  HENT: Negative for congestion and sore throat.   Eyes: Positive for photophobia. Negative for visual disturbance.  Respiratory: Negative for cough and wheezing.   Cardiovascular: Negative for chest pain and palpitations.  Gastrointestinal: Negative for nausea, vomiting, abdominal pain and diarrhea.  Endocrine: Positive for cold intolerance and heat intolerance.  Genitourinary: Negative for dysuria and frequency.  Musculoskeletal: Positive for gait problem and neck stiffness. Negative for arthralgias.  Skin: Negative for rash.  Neurological: Positive for dizziness, facial asymmetry, weakness, light-headedness and numbness. Negative for tremors and headaches.       Left sided face numbness  Psychiatric/Behavioral: Negative for dysphoric mood. The patient is nervous/anxious.       Allergies  Review of patient's allergies indicates no known allergies.  Home Medications   Prior to Admission medications  Medication Sig Start Date End Date Taking? Authorizing Provider  ALPRAZolam Duanne Moron) 1 MG tablet Take 1 mg by mouth at bedtime as needed for sleep.     Historical Provider, MD  anastrozole (ARIMIDEX) 1 MG tablet Take 1 tablet (1 mg total) by mouth daily. 08/27/15   Patrici Ranks, MD  [place] 5-2.5-18.5 injection  08/06/15   Historical Provider, MD  clonazePAM (KLONOPIN) 1 MG tablet Take 1 mg by mouth 4 (four)  times daily.    Historical Provider, MD  cyanocobalamin (,VITAMIN B-12,) 1000 MCG/ML injection INJECT EVERY 4 WEEKS AS DIRECTED 03/19/15   Historical Provider, MD  Cyanocobalamin (VITAMIN B-12 IJ) Inject 1 mL as directed every 30 (thirty) days.     Historical Provider, MD  glipiZIDE (GLUCOTROL) 10 MG tablet Take 10 mg by mouth 2 (two) times daily before a meal.    Historical Provider, MD  HYDROcodone-acetaminophen (NORCO/VICODIN) 5-325 MG tablet Take 1-2 tablets by mouth every 6 (six) hours as needed. Patient not taking: Reported on 06/05/2015 05/13/15   [name], MD  hydrOXYzine (ATARAX/VISTARIL) 10 MG tablet Take 10 mg by mouth daily.    Historical Provider, MD  insulin glargine (LANTUS) 100 UNIT/ML injection Inject 30 Units into the skin daily.     Historical Provider, MD  meclizine (ANTIVERT) 50 MG tablet Take 0.5 tablets (25 mg total) by mouth 3 (three) times daily as needed for dizziness. 10/15/14   Pamella Pert, MD  metFORMIN (GLUCOPHAGE) 1000 MG tablet Take 500 mg by mouth daily.     Historical Provider, MD  PARoxetine (PAXIL) 20 MG tablet Take 20 mg by mouth daily.    Historical Provider, MD  ranitidine (ZANTAC) 150 MG tablet Take 150 mg by mouth as needed for heartburn. Reported on 09/25/2015    Historical Provider, MD  sertraline (ZOLOFT) 50 MG tablet Take 50 mg by mouth daily.    Historical Provider, MD  simvastatin (ZOCOR) 40 MG tablet Take 40 mg by mouth every evening. Reported on 09/25/2015    Historical Provider, MD  traZODone (DESYREL) 100 MG tablet Take 100 mg by mouth at bedtime.    Historical Provider, MD   BP 132/68 mmHg  Pulse 64  Temp(Src) 98.6 F (37 C) (Oral)  Resp 16  Ht 5\' 3"  (1.6 m)  Wt 73.483 kg  BMI 28.70 kg/m2  SpO2 100% Physical Exam  Constitutional: She appears well-developed and well-nourished. No distress.  HENT:  Head: Normocephalic and atraumatic.  Mouth/Throat: Oropharynx is clear and moist. No oropharyngeal exudate.  Eyes: EOM are normal. Pupils  are equal, round, and reactive to light. No scleral icterus.  Neck: Normal range of motion. Neck supple.  No carotid bruits.  Cardiovascular: Normal rate, regular rhythm and normal heart sounds.   No murmur heard. Pulmonary/Chest: Effort normal and breath sounds normal. No respiratory distress. She has no wheezes.  Abdominal: Soft. Bowel sounds are normal. There is no tenderness.  Musculoskeletal: Normal range of motion. She exhibits no edema.  Lymphadenopathy:    She has no cervical adenopathy.  Neurological:  Speech has delayed initiate, but comprehension and verbalization in tact. Can repeat no "if's ands or buts". AAOx4. Recalled 2 of 3 items after 5 minutes. 5/5 strength UE, Giveaway weakness in LE. No visual field cut. Tongue midline, face symmetric. Sensation to light touch in tact throughout. +Romberg. Normal gait. Normal FTN. No disdiadokinesia. Appears tremulous.  Skin: Skin is warm and dry. No rash noted. She is not diaphoretic. No erythema.  Psychiatric:  Appears  anxious. Behavior normal.  Nursing note and vitals reviewed.   ED Course  Procedures (including critical care time) Labs Review Labs Reviewed  URINALYSIS, ROUTINE W REFLEX MICROSCOPIC (NOT AT [facility]) - Abnormal; Notable for the following:    Glucose, UA 100 (*)    All other components within normal limits  CBG MONITORING, ED - Abnormal; Notable for the following:    Glucose-Capillary 270 (*)    All other components within normal limits  URINE CULTURE    Imaging Review Mr Kizzie Fantasia Contrast  09/25/2015  CLINICAL DATA:  Aphasia.  History of breast cancer. EXAM: MRI HEAD WITHOUT AND WITH CONTRAST TECHNIQUE: Multiplanar, multiecho pulse sequences of the brain and surrounding structures were obtained without and with intravenous contrast. CONTRAST:  85mL MULTIHANCE GADOBENATE DIMEGLUMINE 529 MG/ML IV SOLN COMPARISON:  10/15/2014 FINDINGS: There is no evidence of acute infarct, intracranial hemorrhage, mass, midline  shift, or extra-axial fluid collection. Ventricles and sulci are within normal limits for age. Scattered, punctate foci of T2 hyperintensity in the cerebral white matter bilaterally are unchanged and nonspecific but compatible with minimal chronic small vessel ischemic disease, less than is often seen in patients of this age. Small, chronic infarcts are again seen in the left cerebellum. No abnormal enhancement is identified. Orbits are unremarkable. Paranasal sinuses and mastoid air cells are clear. Major intracranial vascular flow voids are preserved. No suspicious skull lesions are seen. IMPRESSION: 1. No acute intracranial abnormality or mass. 2. Chronic left cerebellar infarcts. Electronically Signed   By: Logan Bores M.D.   On: 09/25/2015 19:03   I have personally reviewed and evaluated these images and lab results as part of my medical decision-making.   EKG Interpretation None      MDM   Final diagnoses:  Speech problem    MRI shows no evidence of brain mets or ischemic changes - only prior cerebellar infarct. Electrolytes and CBC are reassuring. No macrocytic anemia, making B12 deficiency less likely. There is no clearly defined neurological issue that requires either emergent management or admission. Her speech continues to be comprehensible, albeit somewhat delayed. UA normal. This may require follow-up with a neurologist. She was reassured that there was no new stroke, instructions were given, and she was discharged in good condition.        Liberty Handy, MD 09/25/15 [id number]  Elnora Morrison, MD 09/25/15 [phone]

## 2015-09-25 NOTE — Patient Instructions (Addendum)
[place] at [facility] Discharge Instructions  RECOMMENDATIONS MADE BY THE CONSULTANT AND ANY TEST RESULTS WILL BE SENT TO YOUR REFERRING PHYSICIAN.   Exam and discussion by Dr Whitney Muse today Your bone density was good. Vitamin D level is still pending we will call you with those results Continue taking arimidex as prescribed  Return to see the doctor in 2 months Please call the clinic if you have any questions or concerns    Thank you for choosing [place] at [facility] to provide your oncology and hematology care.  To afford each patient quality time with our provider, please arrive at least 15 minutes before your scheduled appointment time.   Beginning January 23rd 2017 lab work for the [facility] will be done in the  Main lab at [facility] on 1st floor. If you have a lab appointment with the [facility] please come in thru the  Main Entrance and check in at the main information desk  You need to re-schedule your appointment should you arrive 10 or more minutes late.  We strive to give you quality time with our providers, and arriving late affects you and other patients whose appointments are after yours.  Also, if you no show three or more times for appointments you may be dismissed from the clinic at the providers discretion.     Again, thank you for choosing [facility].  Our hope is that these requests will decrease the amount of time that you wait before being seen by our physicians.       _____________________________________________________________  Should you have questions after your visit to [facility], please contact our office at [phone] between the hours of 8:30 a.m. and 4:30 p.m.  Voicemails left after 4:30 p.m. will not be returned until the following business day.  For prescription refill requests, have your pharmacy contact our office.         Resources For Cancer Patients  and their Caregivers ? American Cancer Society: Can assist with transportation, wigs, general needs, runs Look Good Feel Better.        850-273-0968 ? Cancer Care: Provides financial assistance, online support groups, medication/co-pay assistance.  1-800-813-HOPE 7133108740) ? [place] Assists [place] cancer patients and their families through emotional , educational and financial support.  (517)034-6888 ? [place] DSS Where to apply for food stamps, Medicaid and utility assistance. (207)804-1768 ? [facility]: Transportation to medical appointments. 386-256-3411 ? Social Security Administration: May apply for disability if have a Stage IV cancer. 351-187-8769 (216)444-7321 ? [employer], Disability and Transit Services: Assists with nutrition, care and transit needs. 760-382-5935 \

## 2015-09-27 LAB — URINE CULTURE: Culture: 3000

## 2015-09-28 LAB — VITAMIN D 25 HYDROXY (VIT D DEFICIENCY, FRACTURES): VIT D 25 HYDROXY: 33.7 ng/mL (ref 30.0–100.0)

## 2015-10-03 DIAGNOSIS — E7800 Pure hypercholesterolemia, unspecified: Secondary | ICD-10-CM | POA: Diagnosis not present

## 2015-10-03 DIAGNOSIS — S59902A Unspecified injury of left elbow, initial encounter: Secondary | ICD-10-CM | POA: Diagnosis not present

## 2015-10-03 DIAGNOSIS — E119 Type 2 diabetes mellitus without complications: Secondary | ICD-10-CM | POA: Diagnosis not present

## 2015-10-03 DIAGNOSIS — M79622 Pain in left upper arm: Secondary | ICD-10-CM | POA: Diagnosis not present

## 2015-10-03 DIAGNOSIS — Z86718 Personal history of other venous thrombosis and embolism: Secondary | ICD-10-CM | POA: Diagnosis not present

## 2015-10-03 DIAGNOSIS — Z7982 Long term (current) use of aspirin: Secondary | ICD-10-CM | POA: Diagnosis not present

## 2015-10-03 DIAGNOSIS — Z794 Long term (current) use of insulin: Secondary | ICD-10-CM | POA: Diagnosis not present

## 2015-10-03 DIAGNOSIS — Z86711 Personal history of pulmonary embolism: Secondary | ICD-10-CM | POA: Diagnosis not present

## 2015-10-03 DIAGNOSIS — C50919 Malignant neoplasm of unspecified site of unspecified female breast: Secondary | ICD-10-CM | POA: Diagnosis not present

## 2015-10-03 DIAGNOSIS — M25522 Pain in left elbow: Secondary | ICD-10-CM | POA: Diagnosis not present

## 2015-10-03 DIAGNOSIS — Z79899 Other long term (current) drug therapy: Secondary | ICD-10-CM | POA: Diagnosis not present

## 2015-10-04 DIAGNOSIS — Z86718 Personal history of other venous thrombosis and embolism: Secondary | ICD-10-CM | POA: Diagnosis not present

## 2015-10-04 DIAGNOSIS — M79622 Pain in left upper arm: Secondary | ICD-10-CM | POA: Diagnosis not present

## 2015-10-05 DIAGNOSIS — E119 Type 2 diabetes mellitus without complications: Secondary | ICD-10-CM | POA: Diagnosis not present

## 2015-10-05 DIAGNOSIS — I82622 Acute embolism and thrombosis of deep veins of left upper extremity: Secondary | ICD-10-CM | POA: Diagnosis not present

## 2015-10-05 DIAGNOSIS — E7800 Pure hypercholesterolemia, unspecified: Secondary | ICD-10-CM | POA: Diagnosis not present

## 2015-10-05 DIAGNOSIS — I82612 Acute embolism and thrombosis of superficial veins of left upper extremity: Secondary | ICD-10-CM | POA: Diagnosis not present

## 2015-10-05 DIAGNOSIS — Z794 Long term (current) use of insulin: Secondary | ICD-10-CM | POA: Diagnosis not present

## 2015-10-05 DIAGNOSIS — C50919 Malignant neoplasm of unspecified site of unspecified female breast: Secondary | ICD-10-CM | POA: Diagnosis not present

## 2015-10-05 DIAGNOSIS — Z79899 Other long term (current) drug therapy: Secondary | ICD-10-CM | POA: Diagnosis not present

## 2015-10-05 DIAGNOSIS — Z7982 Long term (current) use of aspirin: Secondary | ICD-10-CM | POA: Diagnosis not present

## 2015-10-05 DIAGNOSIS — Z9010 Acquired absence of unspecified breast and nipple: Secondary | ICD-10-CM | POA: Diagnosis not present

## 2015-10-05 DIAGNOSIS — Z86718 Personal history of other venous thrombosis and embolism: Secondary | ICD-10-CM | POA: Diagnosis not present

## 2015-10-05 DIAGNOSIS — Z86711 Personal history of pulmonary embolism: Secondary | ICD-10-CM | POA: Diagnosis not present

## 2015-10-07 DIAGNOSIS — E119 Type 2 diabetes mellitus without complications: Secondary | ICD-10-CM | POA: Diagnosis not present

## 2015-10-07 DIAGNOSIS — I82729 Chronic embolism and thrombosis of deep veins of unspecified upper extremity: Secondary | ICD-10-CM | POA: Diagnosis not present

## 2015-10-22 DIAGNOSIS — I82729 Chronic embolism and thrombosis of deep veins of unspecified upper extremity: Secondary | ICD-10-CM | POA: Diagnosis not present

## 2015-10-22 DIAGNOSIS — Z23 Encounter for immunization: Secondary | ICD-10-CM | POA: Diagnosis not present

## 2015-10-26 NOTE — Progress Notes (Signed)
No show

## 2015-10-26 NOTE — Assessment & Plan Note (Signed)
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 [name] on 05/13/2015, followed by XRT from 06/30/2015- 07/23/2015 by Dr. [name].  Now on Arimidex beginning on 08/28/2015.  Oncology history updated.  Staging in [facility] problem list is completed.  I personally reviewed and went over radiographic studies with the patient.  The results are noted within this dictation.    No role for labs today.  Labs in 4 weeks: CBC diff, CMET.  Return as scheduled in 4 weeks for follow-up.

## 2015-10-27 ENCOUNTER — Ambulatory Visit (HOSPITAL_COMMUNITY): Payer: Medicare Other | Admitting: Oncology

## 2015-11-10 ENCOUNTER — Encounter: Payer: Medicare Other | Admitting: Nurse Practitioner

## 2015-11-26 ENCOUNTER — Encounter (HOSPITAL_COMMUNITY): Payer: Medicare Other | Attending: Hematology & Oncology | Admitting: Hematology & Oncology

## 2015-11-26 ENCOUNTER — Encounter (HOSPITAL_COMMUNITY): Payer: Self-pay | Admitting: Hematology & Oncology

## 2015-11-26 ENCOUNTER — Ambulatory Visit (INDEPENDENT_AMBULATORY_CARE_PROVIDER_SITE_OTHER): Payer: Medicare Other | Admitting: Otolaryngology

## 2015-11-26 VITALS — BP 114/69 | HR 65 | Temp 98.1°F | Resp 18 | Wt 164.6 lb

## 2015-11-26 DIAGNOSIS — N6459 Other signs and symptoms in breast: Secondary | ICD-10-CM

## 2015-11-26 DIAGNOSIS — C50511 Malignant neoplasm of lower-outer quadrant of right female breast: Secondary | ICD-10-CM

## 2015-11-26 DIAGNOSIS — I749 Embolism and thrombosis of unspecified artery: Secondary | ICD-10-CM

## 2015-11-26 DIAGNOSIS — Z79899 Other long term (current) drug therapy: Secondary | ICD-10-CM

## 2015-11-26 MED ORDER — LETROZOLE 2.5 MG PO TABS: 2.5000 mg | ORAL_TABLET | Freq: Every day | ORAL | Status: DC

## 2015-11-26 NOTE — Patient Instructions (Addendum)
[place] at [facility]  Discharge Instructions:  Seen and evaluated by Dr. Whitney Muse today.  Follow up visit in 4 weeks with labs prior.   Start on Femara medication.  Hold Xarelto x3 days before your visit with Dr. [name].  Referral to Dr. [name] (surgeon) for follow-up visit and his office will call you for a visit for next week. It is very important that you follow-up with Dr. [name]. _______________________________________________________________  Thank you for choosing [place] at [facility] to provide your oncology and hematology care.  To afford each patient quality time with our providers, please arrive at least 15 minutes before your scheduled appointment.  You need to re-schedule your appointment if you arrive 10 or more minutes late.  We strive to give you quality time with our providers, and arriving late affects you and other patients whose appointments are after yours.  Also, if you no show three or more times for appointments you may be dismissed from the clinic.  Again, thank you for choosing [place] at [place] hope is that these requests will allow you access to exceptional care and in a timely manner. _______________________________________________________________  If you have questions after your visit, please contact our office at [phone] between the hours of 8:30 a.m. and 5:00 p.m. Voicemails left after 4:30 p.m. will not be returned until the following business day. _______________________________________________________________  For prescription refill requests, have your pharmacy contact our office. _______________________________________________________________  Recommendations made by the consultant and any test results will be sent to your referring physician. _______________________________________________________________

## 2015-11-26 NOTE — Progress Notes (Signed)
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 [name] R breast with invasive grade 1 ductal carcinoma spanning 0.5 cm ER+ PR+ HER 2 neu -, pT1apN0 L breast negative for malignancy or atypia ONCOTYPE unable to be performed   Breast cancer of lower-outer quadrant of right female breast ([place])   03/02/2015 Initial Diagnosis Right breast biopsy 8:00: IDC with extracellular mucin, ER 100%, PR 50%, Ki-67 20%, grade 1 HER-2 negative ratio 1.11; left breast biopsy 4:00: Intraductal papilloma with UDH and fibrocystic changes   03/06/2015 Breast MRI Right breast 8:00 position: 6 mm enhancing nodule with associated dilated duct and with blood; left breast 9:00 middle depth: 4 mm nodule, 4:00: 1 cm nodule nonspecific appearance   05/13/2015 Procedure Left partial mastectomy by Dr. [name]   05/13/2015 Pathology Results Invasive ductal carcinoma of left breast, 0.5 cm, grade I, without LVI.  T1AN0   06/30/2015 - 07/23/2015 Radiation Therapy 42.72 Gy at 2.67 Gy per fraction x 16 fractions by Dr. [name] in [place].   08/28/2015 -  Anti-estrogen oral therapy Arimidex daily.   09/23/2015 Imaging Bone density- BMD as determined from Femur Neck Left is 0.932 g/cm2 with a T-Score of -0.8. This patient is considered normal according to [facility]) criteria.   10/26/2015 Imaging MRI brain- negative for any acute intracranial abnormality or mass  chronic left cerebellar infarcts.    HISTORY OF PRESENTING ILLNESS:  Olivia Mora 70 y.o. female is here for follow-up of Right breast invasive ductal carcinoma. She originally presented with complaints of nipple discharge.   She saw Dr. Elonda Husky in consultation. She underwent a mammogram in May of this year and a ductogram was recommended. The ductogram was attempted on 01/08/2015 but was unable to be performed. Consultation with a breast surgeon was recommended.   She saw Dr. [name] and a breast MRI was ordered. In the right breast at 8:00 there was an enhancing nodule with associated dilated duct filled with sanguinous material. In the left breast at the 9:00 position there was a progressively enhancing nodule, and at the 4:00 position enhancement with a nonspecific appearance.   Right breast biopsy 04/02/2015: 8:00: IDC with extracellular mucin, ER 100%, PR 50%, Ki-67 20%, grade 1 HER-2 negative ratio 1.11, T1b N0 stage I a clinical stage; left breast biopsy 4:00: Intraductal papilloma with UDH and fibrocystic changes.  Olivia Mora returns to the [facility] alone today.  At our last visit we had sent her to the ED because of significant confusion. She could not remember how to get onto the examination table. She was seen in the ED. MRI showed prior cerebellar infarct, no new infarct. She continues to have vertigo. She has stopped taking her Arimidex because she feels it was the cause of her worsening confusion and dizziness. She could hardly get off the couch because of this. Since she has quit taking it her dizziness has gotten better but it still not completley gone. She is willing to try another "breast cancer pill."   She overall feels better than before.  She is now on XARELTO for a "blood clot." This was started by her primary. No Ultrasound in [facility]. She notes this "clot" was in her LUE.   She has had many blood clots over the years and has had several blood clots in her lungs. Had a  big one in her right leg many years ago. Noticed left arm was getting red about 2 months ago and knew it was a blood clot.  She says Dr. Willey Blade tested to see if blood clots ran in her family. It does not.   She notes today that her breasts have changed since her last visit here and she would also like this examined.  In terms of breast cancer risk profile:  She menarched at early age of 30 and went to menopause at age 67  She had 2 pregnancy, her first child was born at age 45  She has received birth control pills for approximately 9 years.  She was exposed to hormone replacement therapy for 20 years  She has no family history of Breast/GYN/GI cancer  MEDICAL HISTORY:  Past Medical History  Diagnosis Date  . Diabetes ([place])   . Hyperlipidemia   . Anxiety   . GERD (gastroesophageal reflux disease)   . DVT (deep vein thrombosis) in pregnancy   . Abnormal EKG   . Mitral regurgitation     mild/moderate, echo, 10/2011  . Depression   . Vertigo     SURGICAL HISTORY: Past Surgical History  Procedure Laterality Date  . Tubal ligation    . Appendectomy    . Other surgical history      jaw  . Other surgical history      cyst removal jaw  . Breast lumpectomy with radioactive seed and sentinel lymph node biopsy Right 05/13/2015    Procedure:  RIGHT BREAST LUMPECTOMY WITH RADIOACTIVE SEED AND RIGHT SENTINEL LYMPH NODE BIOPSY;  Surgeon: [name], MD;  Location: [facility];  Service: General;  Laterality: Right;  . Breast lumpectomy with radioactive seed localization Left 05/13/2015    Procedure: LEFT BREAST LUMPECTOMY WITH RADIOACTIVE SEED LOCALIZATION;  Surgeon: [name], MD;  Location: [facility];  Service: General;  Laterality: Left;    SOCIAL HISTORY: Social History   Social History  . Marital Status: Married    Spouse Name: N/A  . Number of Children: 2  . Years of Education: N/A   Occupational History  . Not on file.   Social History Main  Topics  . Smoking status: Never Smoker   . Smokeless tobacco: Never Used  . Alcohol Use: No  . Drug Use: No  . Sexual Activity: Yes    Birth Control/ Protection: Post-menopausal   Other Topics Concern  . 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  He also has a cardiac condition  2 children, 5 grandchildren Never smoked EtOH, none Previously employed for a [employer]  FAMILY HISTORY: Family History  Problem Relation Age of Onset  . Heart attack Brother 68  . CAD      Several family memebers  . Heart disease      Several family memebers    ALLERGIES:  has No Known Allergies.  MEDICATIONS:  Current Outpatient Prescriptions  Medication Sig Dispense Refill  . cetirizine (ZYRTEC) 10 MG tablet Take 10 mg by mouth daily.    Marland Kitchen  cholecalciferol (VITAMIN D) 1000 units tablet Take 1,000 Units by mouth daily.    . clonazePAM (KLONOPIN) 1 MG tablet Take 1 mg by mouth 4 (four) times daily.    . cyanocobalamin (,VITAMIN B-12,) 1000 MCG/ML injection INJECT EVERY 4 WEEKS AS DIRECTED  6  . Cyanocobalamin (VITAMIN B-12 IJ) Inject 1 mL as directed every 30 (thirty) days.     Marland Kitchen glipiZIDE (GLUCOTROL) 10 MG tablet Take 10 mg by mouth 2 (two) times daily before a meal.    . insulin glargine (LANTUS) 100 UNIT/ML injection Inject 30 Units into the skin daily.     Marland Kitchen LANTUS SOLOSTAR 100 UNIT/ML Solostar Pen INJECT 30 UNITS EVERY MORNING  12  . meclizine (ANTIVERT) 50 MG tablet Take 0.5 tablets (25 mg total) by mouth 3 (three) times daily as needed for dizziness. 30 tablet 0  . metFORMIN (GLUCOPHAGE) 1000 MG tablet Take 500 mg by mouth daily. Alternates 1-2 a day    . Multiple Minerals-Vitamins (CALCIUM & VIT D3 BONE HEALTH PO) Take 1 tablet by mouth daily.    Marland Kitchen PARoxetine (PAXIL) 20 MG tablet Take 20 mg by mouth daily.    . simvastatin (ZOCOR) 40 MG tablet Take 40 mg by  mouth every evening. Reported on 09/25/2015    . traZODone (DESYREL) 100 MG tablet Take 100 mg by mouth at bedtime.    Alveda Reasons 20 MG TABS tablet Take 20 mg by mouth daily.  12  . ALPRAZolam (XANAX) 1 MG tablet Take 1 mg by mouth at bedtime as needed for sleep. Reported on 11/26/2015    . anastrozole (ARIMIDEX) 1 MG tablet Take 1 tablet (1 mg total) by mouth daily. (Patient not taking: Reported on 11/26/2015) 30 tablet 3  . [place] 5-2.5-18.5 injection Reported on 11/26/2015    . HYDROcodone-acetaminophen (NORCO/VICODIN) 5-325 MG tablet Take 1-2 tablets by mouth every 6 (six) hours as needed. (Patient not taking: Reported on 06/05/2015) 30 tablet 0  . hydrOXYzine (ATARAX/VISTARIL) 10 MG tablet Take 10 mg by mouth daily. Reported on 11/26/2015    . ranitidine (ZANTAC) 150 MG tablet Take 150 mg by mouth as needed for heartburn. Reported on 11/26/2015    . sertraline (ZOLOFT) 50 MG tablet Take 50 mg by mouth daily. Reported on 11/26/2015     No current facility-administered medications for this visit.    REVIEW OF SYSTEMS:   Constitutional: Denies fevers, chills or abnormal night sweats Eyes: Denies blurriness of vision, double vision or watery eyes Ears, nose, mouth, throat, and face: Denies mucositis or sore throat Respiratory: Denies cough, dyspnea or wheezes Cardiovascular: Denies palpitation, chest discomfort or lower extremity swelling Gastrointestinal:  Denies nausea, heartburn or change in bowel habits Skin:  Lymphatics: Denies new lymphadenopathy or easy bruising Neurological:Denies numbness, tingling or new weaknesses, confusion, dizziness Behavioral/Psych: Mood is stable, no new changes.  Anxiety/Nervousness Breast: bloody nipple discharge All other systems were reviewed with the patient and are negative.  14 point review of systems was performed and is negative except as detailed under history of present illness and above   PHYSICAL EXAMINATION: ECOG PERFORMANCE STATUS: 1 -  Symptomatic but completely ambulatory  Filed Vitals:   11/26/15 1355  BP: 114/69  Pulse: 65  Temp: 98.1 F (36.7 C)  Resp: 18   Filed Weights   11/26/15 1355  Weight: 164 lb 9.6 oz (74.662 kg)   GENERAL:alert, no distress and comfortable SKIN: skin color, texture, turgor are normal, no rashes or significant lesions EYES: normal, conjunctiva are  pink and non-injected, sclera clear OROPHARYNX:no exudate, no erythema and lips, buccal mucosa, and tongue normal  NECK: supple, thyroid normal size, non-tender, without nodularity LYMPH:  no palpable lymphadenopathy in the cervical, axillary or inguinal LUNGS: clear to auscultation and percussion with normal breathing effort HEART: regular rate & rhythm and no murmurs and no lower extremity edema ABDOMEN:abdomen soft, non-tender and normal bowel sounds Musculoskeletal:no cyanosis of digits and no clubbing  PSYCH: alert & oriented x 3 with fluent speech NEURO: no focal motor/sensory deficits, baselin Breast: R breast along prior inferior incision line firm discrete palpable area (superficial) approx 1.5 cm , mild erythema. L breast incision site with skin changes but not palpable, no erythema  LABORATORY DATA:  I have reviewed the data as listed  Results for Olivia, Mora (MRN [id number]) as of 11/26/2015 12:30  Ref. Range 09/25/2015 12:41  Sodium Latest Ref Range: 135-145 mmol/L 136  Potassium Latest Ref Range: 3.5-5.1 mmol/L 4.5  Chloride Latest Ref Range: 101-111 mmol/L 102  CO2 Latest Ref Range: 22-32 mmol/L 26  BUN Latest Ref Range: 6-20 mg/dL 17  Creatinine Latest Ref Range: 0.44-1.00 mg/dL 0.92  Calcium Latest Ref Range: 8.9-10.3 mg/dL 9.3  EGFR (Non-African Amer.) Latest Ref Range: >60 mL/min >60  EGFR (African American) Latest Ref Range: >60 mL/min >60  Glucose Latest Ref Range: 65-99 mg/dL 460 (H)  Anion gap Latest Ref Range: 5-15  8  Alkaline Phosphatase Latest Ref Range: 38-126 U/L 68  Albumin Latest Ref Range: 3.5-5.0  g/dL 3.7  AST Latest Ref Range: 15-41 U/L 18  ALT Latest Ref Range: 14-54 U/L 18  Total Protein Latest Ref Range: 6.5-8.1 g/dL 6.1 (L)  Total Bilirubin Latest Ref Range: 0.3-1.2 mg/dL 0.4  Vitamin D, 25-Hydroxy Latest Ref Range: 30.0-100.0 ng/mL 33.7  WBC Latest Ref Range: 4.0-10.5 K/uL 5.0  RBC Latest Ref Range: 3.87-5.11 MIL/uL 4.26  Hemoglobin Latest Ref Range: 12.0-15.0 g/dL 12.8  HCT Latest Ref Range: 36.0-46.0 % 37.6  MCV Latest Ref Range: 78.0-100.0 fL 88.3  MCH Latest Ref Range: 26.0-34.0 pg 30.0  MCHC Latest Ref Range: 30.0-36.0 g/dL 34.0  RDW Latest Ref Range: 11.5-15.5 % 13.2  Platelets Latest Ref Range: 150-400 K/uL 209  Neutrophils Latest Units: % 63  Lymphocytes Latest Units: % 29  Monocytes Relative Latest Units: % 7  Eosinophil Latest Units: % 1  Basophil Latest Units: % 0  NEUT# Latest Ref Range: 1.7-7.7 K/uL 3.2  Lymphocyte # Latest Ref Range: 0.7-4.0 K/uL 1.4  Monocyte # Latest Ref Range: 0.1-1.0 K/uL 0.4  Eosinophils Absolute Latest Ref Range: 0.0-0.7 K/uL 0.1  Basophils Absolute Latest Ref Range: 0.0-0.1 K/uL 0.0   RADIOGRAPHIC STUDIES: I have personally reviewed the radiological reports and agreed with the findings in the report.  Study Result     CLINICAL DATA: Aphasia. History of breast cancer.  EXAM: MRI HEAD WITHOUT AND WITH CONTRAST  TECHNIQUE: Multiplanar, multiecho pulse sequences of the brain and surrounding structures were obtained without and with intravenous contrast.  CONTRAST: 79m MULTIHANCE GADOBENATE DIMEGLUMINE 529 MG/ML IV SOLN  COMPARISON: 10/15/2014  FINDINGS: There is no evidence of acute infarct, intracranial hemorrhage, mass, midline shift, or extra-axial fluid collection. Ventricles and sulci are within normal limits for age. Scattered, punctate foci of T2 hyperintensity in the cerebral white matter bilaterally are unchanged and nonspecific but compatible with minimal chronic small vessel ischemic disease, less  than is often seen in patients of this age. Small, chronic infarcts are again seen in the left cerebellum. No abnormal  enhancement is identified.  Orbits are unremarkable. Paranasal sinuses and mastoid air cells are clear. Major intracranial vascular flow voids are preserved. No suspicious skull lesions are seen.  IMPRESSION: 1. No acute intracranial abnormality or mass. 2. Chronic left cerebellar infarcts.   Electronically Signed  By: Logan Bores M.D.  On: 09/25/2015 19:03    Study Result     EXAM: DUAL X-RAY ABSORPTIOMETRY (DXA) FOR BONE MINERAL DENSITY  IMPRESSION: Ordering Physician: Dr. Patrici Ranks,  Your patient Olivia Mora completed a BMD test on 09/23/2015 using the [employer] (software version: 14.10) manufactured by [employer]. The following summarizes the results of our evaluation. PATIENT BIOGRAPHICAL: Name: Olivia Mora, Olivia Mora Patient ID: [id number] Birth Date: [birth year] Height: 64.0 in. Gender: Female Exam Date: 09/23/2015 Weight: 160.0 lbs. Indications: Breast Ca, Caucasian, History of Fracture (Adult), Post Menopausal Fractures: Finger Treatments: Arimidex, Calcium, Vitamin D DENSITOMETRY RESULTS: Site Region Measured Date Measured Age WHO Classification Young Adult T-score BMD %Change vs. Previous Significant Change (*) AP Spine L2-L4 09/23/2015 69.6 Normal 0.4 1.249 g/cm2 -3.0% Yes AP Spine L2-L4 03/09/2007 61.0 Normal 0.7 1.287 g/cm2 - -  DualFemur Neck Left 09/23/2015 69.6 Normal -0.8 0.932 g/cm2 -2.8% - DualFemur Neck Left 03/09/2007 61.0 Normal -0.6 0.959 g/cm2 - - ASSESSMENT: BMD as determined from Femur Neck Left is 0.932 g/cm2 with a T-Score of -0.8. This patient is considered normal according to [facility]) criteria. Compared with the prior study on 03/09/2007, the BMD of the lumbar spine shows a statistically significant decrease. Compared with the prior  study on 03/09/2007, the BMD of the left femoral neck shows no statistically significant change. (L-1 was excluded due to advanced degenerative changes.) (Patient does not meet criteria for FRAX assessment.)  World Health Organization [facility]) criteria for post-menopausal, Caucasian Women: Normal: T-score at or above -1 SD Osteopenia: T-score between -1 and -2.5 SD Osteoporosis: T-score at or below -2.5 SD  RECOMMENDATIONS: [place] recommends that FDA-approved medial therapies be considered in postmenopausal women and men age 67 or older with a: 1. Hip or vertebral (clinical or morphometric) fracture. 2. T-Score of < -2.5 at the spine or hip. 3. Ten-year fracture probability by FRAX of 3% or greater for hip fracture or 20% or greater for major osteoporotic fracture.  All treatment decisions require clinical judgment and consideration of individual patient factors, including patient preferences, co-morbidities, previous drug use, risk factors not captured in the FRAX model (e.g. falls, vitamin D deficiency, increased bone turnover, interval significant decline in bone density) and possible under-or over-estimation of fracture risk by FRAX.  All patients should ensure an adequate intake of dietary calcium (1200 mg/d) and vitamin D (800 IU daily) unless contraindicated.  FOLLOW-UP: People with diagnosed cases of osteoporosis or osteopenia should be regularly tested for bone mineral density. For patients eligible for Medicare, routine testing is allowed once every 2 years. Testing frequency can be increased for patients who have rapidly progressing disease, or for those who are receiving medical therapy to restore bone mass.  I have reviewed this report, and agree with the above findings.  [facility]   Electronically Signed  By: David Martinique M.D.  On: 09/23/2015 12:32   PATHOLOGY:       ASSESSMENT AND PLAN:    Breast cancer of lower-outer quadrant of right female breast ([place]) ER+ PR+ HER 2 neu -, pT1apN0, small primary at 0.5 cm L breast biopsy negative ONCOTYPE unable to be performed Adjuvant XRT  Endocrine therapy with Arimidex DEXA 09/23/2015 normal bone density Confusion, CVA Abnormal breast Exam  She has stopped taking her Arimidex because it made her dizzy. She is willing to still continue with endocrine therapy. I will start her on Femara.   I discussed her breast findings with Dr. [name], he will see the patient next week. She was advised to hold her XARELTO for 3 days prior to her appointment with him.  She will return in 4 weeks for a follow up. At that time we will get record from Dr. Willey Blade in regards to her thromboembolic history.  Unfortunately the patient is a poor historian.   All questions were answered. The patient knows to call the clinic with any problems, questions or concerns.   This document serves as a record of services personally performed by Ancil Linsey, MD. It was created on her behalf by Kandace Blitz, a trained medical scribe. The creation of this record is based on the scribe's personal observations and the provider's statements to them. This document has been checked and approved by the attending provider.  I have reviewed the above documentation for accuracy and completeness, and I agree with the above.  This note was electronically signed.  Kelby Fam. Whitney Muse, MD

## 2015-12-02 ENCOUNTER — Other Ambulatory Visit: Payer: Self-pay | Admitting: Surgery

## 2015-12-02 DIAGNOSIS — C50911 Malignant neoplasm of unspecified site of right female breast: Secondary | ICD-10-CM | POA: Diagnosis not present

## 2015-12-02 DIAGNOSIS — L928 Other granulomatous disorders of the skin and subcutaneous tissue: Secondary | ICD-10-CM | POA: Diagnosis not present

## 2015-12-02 DIAGNOSIS — Z853 Personal history of malignant neoplasm of breast: Secondary | ICD-10-CM | POA: Diagnosis not present

## 2015-12-02 DIAGNOSIS — R234 Changes in skin texture: Secondary | ICD-10-CM | POA: Diagnosis not present

## 2015-12-02 DIAGNOSIS — L308 Other specified dermatitis: Secondary | ICD-10-CM | POA: Diagnosis not present

## 2015-12-05 ENCOUNTER — Encounter (HOSPITAL_COMMUNITY): Payer: Self-pay | Admitting: Hematology & Oncology

## 2015-12-28 ENCOUNTER — Other Ambulatory Visit (HOSPITAL_COMMUNITY): Payer: Self-pay | Admitting: Hematology & Oncology

## 2015-12-29 ENCOUNTER — Other Ambulatory Visit (HOSPITAL_COMMUNITY): Payer: Medicare Other

## 2015-12-29 ENCOUNTER — Ambulatory Visit (HOSPITAL_COMMUNITY): Payer: Medicare Other | Admitting: Oncology

## 2016-01-07 DIAGNOSIS — C50111 Malignant neoplasm of central portion of right female breast: Secondary | ICD-10-CM | POA: Diagnosis not present

## 2016-01-07 DIAGNOSIS — E119 Type 2 diabetes mellitus without complications: Secondary | ICD-10-CM | POA: Diagnosis not present

## 2016-01-07 DIAGNOSIS — I82729 Chronic embolism and thrombosis of deep veins of unspecified upper extremity: Secondary | ICD-10-CM | POA: Diagnosis not present

## 2016-02-09 ENCOUNTER — Ambulatory Visit (HOSPITAL_COMMUNITY): Payer: Medicare Other | Admitting: Oncology

## 2016-02-09 ENCOUNTER — Other Ambulatory Visit (HOSPITAL_COMMUNITY): Payer: Medicare Other

## 2016-02-28 ENCOUNTER — Other Ambulatory Visit (HOSPITAL_COMMUNITY): Payer: Self-pay | Admitting: Hematology & Oncology

## 2016-03-17 ENCOUNTER — Encounter (HOSPITAL_COMMUNITY): Payer: Self-pay | Admitting: Oncology

## 2016-03-17 ENCOUNTER — Encounter (HOSPITAL_COMMUNITY): Payer: Medicare Other

## 2016-03-17 ENCOUNTER — Encounter (HOSPITAL_COMMUNITY): Payer: Medicare Other | Attending: Oncology | Admitting: Oncology

## 2016-03-17 VITALS — BP 131/66 | HR 63 | Resp 18 | Wt 167.7 lb

## 2016-03-17 DIAGNOSIS — Z170 Estrogen receptor positive status [ER+]: Secondary | ICD-10-CM | POA: Diagnosis not present

## 2016-03-17 DIAGNOSIS — Z86718 Personal history of other venous thrombosis and embolism: Secondary | ICD-10-CM | POA: Diagnosis not present

## 2016-03-17 DIAGNOSIS — Z9119 Patient's noncompliance with other medical treatment and regimen: Secondary | ICD-10-CM | POA: Diagnosis not present

## 2016-03-17 DIAGNOSIS — Z7901 Long term (current) use of anticoagulants: Secondary | ICD-10-CM

## 2016-03-17 DIAGNOSIS — Z91199 Patient's noncompliance with other medical treatment and regimen due to unspecified reason: Secondary | ICD-10-CM

## 2016-03-17 DIAGNOSIS — C50511 Malignant neoplasm of lower-outer quadrant of right female breast: Secondary | ICD-10-CM

## 2016-03-17 HISTORY — DX: Patient's noncompliance with other medical treatment and regimen: Z91.19

## 2016-03-17 HISTORY — DX: Patient's noncompliance with other medical treatment and regimen due to unspecified reason: Z91.199

## 2016-03-17 LAB — COMPREHENSIVE METABOLIC PANEL
ALT: 24 U/L (ref 14–54)
ANION GAP: 10 (ref 5–15)
AST: 23 U/L (ref 15–41)
Albumin: 3.7 g/dL (ref 3.5–5.0)
Alkaline Phosphatase: 57 U/L (ref 38–126)
BUN: 15 mg/dL (ref 6–20)
CHLORIDE: 103 mmol/L (ref 101–111)
CO2: 23 mmol/L (ref 22–32)
Calcium: 9.2 mg/dL (ref 8.9–10.3)
Creatinine, Ser: 0.86 mg/dL (ref 0.44–1.00)
Glucose, Bld: 314 mg/dL — ABNORMAL HIGH (ref 65–99)
POTASSIUM: 4.3 mmol/L (ref 3.5–5.1)
Sodium: 136 mmol/L (ref 135–145)
Total Bilirubin: 0.5 mg/dL (ref 0.3–1.2)
Total Protein: 5.9 g/dL — ABNORMAL LOW (ref 6.5–8.1)

## 2016-03-17 LAB — CBC WITH DIFFERENTIAL/PLATELET
BASOS ABS: 0 10*3/uL (ref 0.0–0.1)
Basophils Relative: 0 %
EOS PCT: 1 %
Eosinophils Absolute: 0 10*3/uL (ref 0.0–0.7)
HCT: 35.9 % — ABNORMAL LOW (ref 36.0–46.0)
Hemoglobin: 12.1 g/dL (ref 12.0–15.0)
LYMPHS PCT: 27 %
Lymphs Abs: 1.4 10*3/uL (ref 0.7–4.0)
MCH: 30.7 pg (ref 26.0–34.0)
MCHC: 33.7 g/dL (ref 30.0–36.0)
MCV: 91.1 fL (ref 78.0–100.0)
MONO ABS: 0.6 10*3/uL (ref 0.1–1.0)
Monocytes Relative: 11 %
Neutro Abs: 3.2 10*3/uL (ref 1.7–7.7)
Neutrophils Relative %: 61 %
PLATELETS: 184 10*3/uL (ref 150–400)
RBC: 3.94 MIL/uL (ref 3.87–5.11)
RDW: 13.1 % (ref 11.5–15.5)
WBC: 5.2 10*3/uL (ref 4.0–10.5)

## 2016-03-17 NOTE — Assessment & Plan Note (Signed)
Noncompliance with appointments.  Cancelled appointments since being seen last:  02/09/2016 12/29/2015

## 2016-03-17 NOTE — Patient Instructions (Signed)
[place] at [facility] Discharge Instructions  RECOMMENDATIONS MADE BY THE CONSULTANT AND ANY TEST RESULTS WILL BE SENT TO YOUR REFERRING PHYSICIAN.  You were seen by Gershon Mussel today. Prescription for Aromesin and Tamoxifen has been escribed Follow up in 6 to 8 weeks for follow up with labs.  Thank you for choosing [place] at [facility] to provide your oncology and hematology care.  To afford each patient quality time with our provider, please arrive at least 15 minutes before your scheduled appointment time.   Beginning January 23rd 2017 lab work for the [facility] will be done in the  Main lab at [facility] on 1st floor. If you have a lab appointment with the [facility] please come in thru the  Main Entrance and check in at the main information desk  You need to re-schedule your appointment should you arrive 10 or more minutes late.  We strive to give you quality time with our providers, and arriving late affects you and other patients whose appointments are after yours.  Also, if you no show three or more times for appointments you may be dismissed from the clinic at the providers discretion.     Again, thank you for choosing [facility].  Our hope is that these requests will decrease the amount of time that you wait before being seen by our physicians.       _____________________________________________________________  Should you have questions after your visit to [facility], please contact our office at [phone] between the hours of 8:30 a.m. and 4:30 p.m.  Voicemails left after 4:30 p.m. will not be returned until the following business day.  For prescription refill requests, have your pharmacy contact our office.         Resources For Cancer Patients and their Caregivers ? American Cancer Society: Can assist with transportation, wigs, general needs, runs Look Good Feel Better.         705-489-9922 ? Cancer Care: Provides financial assistance, online support groups, medication/co-pay assistance.  1-800-813-HOPE (770) 701-4861) ? [place] Assists [place] cancer patients and their families through emotional , educational and financial support.  (314)376-0967 ? [place] DSS Where to apply for food stamps, Medicaid and utility assistance. (606)682-0269 ? [facility]: Transportation to medical appointments. (854) 820-3976 ? Social Security Administration: May apply for disability if have a Stage IV cancer. 650-726-1833 727-293-2470 ? [employer], Disability and Transit Services: Assists with nutrition, care and transit needs. [place] Support Programs: @10RELATIVEDAYS @ > Cancer Support Group  2nd Tuesday of the month 1pm-2pm, Journey Room  > Creative Journey  3rd Tuesday of the month 1130am-1pm, Journey Room  > Look Good Feel Better  1st Wednesday of the month 10am-12 noon, Journey Room (Call [facility] to register [phone])

## 2016-03-17 NOTE — Assessment & Plan Note (Addendum)
Stage IA (T1AN0M0) right lower-outer quadrant breast cancer, ER/PR+, HER2 negative, small primary measuring 0.5 cm and Oncotype unable to be performed.  S/P left lumpectomy by Dr. [name] on 05/06/2015 followed by adjuvant XRT.  She was then tried on Arimidex which she reported intolerance to secondary to dizziness.  She was then transitioned to Femara beginning on 11/26/2015 and the patient reports that she stopped this medication as well due to similar side effects in June 2017.  Normal baseline bone density on 09/23/2015.  Oncology history is updated.  Labs today: CBC diff, CMET.  I personally reviewed and went over laboratory results with the patient.  The results are noted within this dictation.  She is on Xarelto and tolerating this well without any indication of recurrent VTE or bleeding.  As a result, we discussed a final anti-endocrine therapy option: Tamoxifen.  She is educated on the risks, benefits, alternatives, and side effects of this intervention including, but not limited to, arthralgias, myalgias, hot flashes, increased risk of VTE, and secondary malignancy.  She is willing to try this treatment option.  Before prescribing, I will touch base with her primary care provider to confirm that she will remain on Xarelto anticoagulation.  I spoke with Dr. Willey Blade, PCP, regarding this patient.  He is agreeable to Tamoxifen therapy, knowing that she has a history of VTE.  HOWEVER, she is on anticoagulation with Xarelto and this will be continued.  RX for Tamoxifen 20 mg is escribed to her pharmacy.  Labs in 6-8 weeks: CBC diff, CMET.  Return in 6-8 weeks for follow-up and evaluation of tolerance to Tamoxifen.

## 2016-03-18 MED ORDER — TAMOXIFEN CITRATE 20 MG PO TABS: 20.0000 mg | ORAL_TABLET | Freq: Every day | ORAL | 1 refills | Status: DC

## 2016-03-18 NOTE — Progress Notes (Signed)
Olivia Mora, [place] [address]  Breast cancer of lower-outer quadrant of right female breast [facility]) - Plan: CBC with Differential, Comprehensive metabolic panel, tamoxifen (NOLVADEX) 20 MG tablet  Noncompliance  CURRENT THERAPY: Intolerance to Arimidex and Femara.  INTERVAL HISTORY: Olivia Mora 70 y.o. 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  S/P left lumpectomy by Dr. [name] on 05/06/2015 followed by adjuvant XRT.  She was then tried on Arimidex which she reported intolerance to secondary to dizziness.  She was then transitioned to Femara beginning on 11/26/2015 and the patient reports that she stopped this medication as well due to similar side effects in June 2017.  Normal baseline bone density on 09/23/2015.    Breast cancer of lower-outer quadrant of right female breast ([place])   03/02/2015 Initial Diagnosis    Right breast biopsy 8:00: IDC with extracellular mucin, ER 100%, PR 50%, Ki-67 20%, grade 1 HER-2 negative ratio 1.11; left breast biopsy 4:00: Intraductal papilloma with UDH and fibrocystic changes      03/06/2015 Breast MRI    Right breast 8:00 position: 6 mm enhancing nodule with associated dilated duct and with blood; left breast 9:00 middle depth: 4 mm nodule, 4:00: 1 cm nodule nonspecific appearance      05/13/2015 Procedure    Left partial mastectomy by Dr. 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  T1AN0      06/30/2015 - 07/23/2015 Radiation Therapy    42.72 Gy at 2.67 Gy per fraction x 16 fractions by Dr. [name] in [facility].      08/28/2015 - 11/26/2015 Anti-estrogen oral therapy    Arimidex daily.      09/23/2015 Imaging    Bone density- BMD as determined from Femur Neck Left is 0.932 g/cm2 with a T-Score of  -0.8. This patient is considered normal according to [facility]) criteria.      10/26/2015 Imaging    MRI brain- negative for any acute intracranial abnormality or mass chronic left cerebellar infarcts.      11/25/2015 Adverse Reaction    Intolerance to Arimidex.      11/26/2015 - 12/21/2015 Anti-estrogen oral therapy    Femara      12/21/2015 Adverse Reaction    Dizziness and myalgias.       She reports that she noted intolerance to Femara due to dizziness and increased arthralgias.  She therefore discontinued the medication on her own in June 2017.    Review of Systems  Constitutional: Negative.   HENT: Negative.   Eyes: Negative.   Respiratory: Negative.   Cardiovascular: Negative.   Gastrointestinal: Negative.   Genitourinary: Negative.   Musculoskeletal: Negative.   Skin: Negative.   Neurological: Negative.   Endo/Heme/Allergies: Negative.   Psychiatric/Behavioral: Negative.     Past Medical History:  Diagnosis Date  . Abnormal EKG   . Anxiety   . Depression   . Diabetes ([place])   . DVT (deep vein thrombosis) in pregnancy   . GERD (gastroesophageal reflux disease)   . Hyperlipidemia   . Mitral regurgitation    mild/moderate, echo, 10/2011  . Noncompliance 03/17/2016  . Vertigo     Past Surgical History:  Procedure Laterality Date  . APPENDECTOMY    .  BREAST LUMPECTOMY WITH RADIOACTIVE SEED AND SENTINEL LYMPH NODE BIOPSY Right 05/13/2015   Procedure:  RIGHT BREAST LUMPECTOMY WITH RADIOACTIVE SEED AND RIGHT SENTINEL LYMPH NODE BIOPSY;  Surgeon: [name], MD;  Location: [place];  Service: General;  Laterality: Right;  . BREAST LUMPECTOMY WITH RADIOACTIVE SEED LOCALIZATION Left 05/13/2015   Procedure: LEFT BREAST LUMPECTOMY WITH RADIOACTIVE SEED LOCALIZATION;  Surgeon: [name], MD;  Location: [place];  Service: General;  Laterality: Left;  . OTHER SURGICAL HISTORY     jaw  . OTHER SURGICAL HISTORY       cyst removal jaw  . TUBAL LIGATION      Family History  Problem Relation Age of Onset  . Heart attack Brother 62  . CAD      Several family memebers  . Heart disease      Several family memebers    Social History   Social History  . Marital status: Married    Spouse name: N/A  . Number of children: 2  . Years of education: N/A   Social History Main Topics  . Smoking status: Never Smoker  . Smokeless tobacco: Never Used  . Alcohol use No  . Drug use: No  . Sexual activity: Yes    Birth control/ protection: Post-menopausal   Other Topics Concern  . None   Social History Narrative  . None     PHYSICAL EXAMINATION  ECOG PERFORMANCE STATUS: 1 - Symptomatic but completely ambulatory  Vitals:   03/17/16 1400  BP: 131/66  Pulse: 63  Resp: 18    GENERAL:alert, no distress, well nourished, well developed, comfortable, cooperative and chronically ill appearing, unaccompanied, flat affect SKIN: skin color, texture, turgor are normal, no rashes or significant lesions HEAD: Normocephalic, No masses, lesions, tenderness or abnormalities EYES: normal, EOMI, Conjunctiva are pink and non-injected EARS: External ears normal OROPHARYNX:lips, buccal mucosa, and tongue normal and mucous membranes are moist  NECK: supple, no adenopathy, trachea midline LYMPH:  no palpable lymphadenopathy BREAST:not examined LUNGS: clear to auscultation  HEART: regular rate & rhythm ABDOMEN:abdomen soft and normal bowel sounds BACK: Back symmetric, no curvature. EXTREMITIES:less then 2 second capillary refill, no joint deformities, effusion, or inflammation, no skin discoloration, no cyanosis  NEURO: alert & oriented x 3 with fluent speech, no focal motor/sensory deficits   LABORATORY DATA: CBC    Component Value Date/Time   WBC 5.2 03/17/2016 1421   RBC 3.94 03/17/2016 1421   HGB 12.1 03/17/2016 1421   HCT 35.9 (L) 03/17/2016 1421   PLT 184 03/17/2016 1421   MCV 91.1 03/17/2016  1421   MCH 30.7 03/17/2016 1421   MCHC 33.7 03/17/2016 1421   RDW 13.1 03/17/2016 1421   LYMPHSABS 1.4 03/17/2016 1421   MONOABS 0.6 03/17/2016 1421   EOSABS 0.0 03/17/2016 1421   BASOSABS 0.0 03/17/2016 1421      Chemistry      Component Value Date/Time   NA 136 03/17/2016 1421   K 4.3 03/17/2016 1421   CL 103 03/17/2016 1421   CO2 23 03/17/2016 1421   BUN 15 03/17/2016 1421   CREATININE 0.86 03/17/2016 1421      Component Value Date/Time   CALCIUM 9.2 03/17/2016 1421   ALKPHOS 57 03/17/2016 1421   AST 23 03/17/2016 1421   ALT 24 03/17/2016 1421   BILITOT 0.5 03/17/2016 1421        PENDING LABS:   RADIOGRAPHIC STUDIES:  No results found.   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  S/P left lumpectomy by Dr. [name] on 05/06/2015 followed by adjuvant XRT.  She was then tried on Arimidex which she reported intolerance to secondary to dizziness.  She was then transitioned to Femara beginning on 11/26/2015 and the patient reports that she stopped this medication as well due to similar side effects in June 2017.  Normal baseline bone density on 09/23/2015.  Oncology history is updated.  Labs today: CBC diff, CMET.  I personally reviewed and went over laboratory results with the patient.  The results are noted within this dictation.  She is on Xarelto and tolerating this well without any indication of recurrent VTE or bleeding.  As a result, we discussed a final anti-endocrine therapy option: Tamoxifen.  She is educated on the risks, benefits, alternatives, and side effects of this intervention including, but not limited to, arthralgias, myalgias, hot flashes, increased risk of VTE, and secondary malignancy.  She is willing to try this treatment option.  Before  prescribing, I will touch base with her primary care provider to confirm that she will remain on Xarelto anticoagulation.  I spoke with Dr. Willey Blade, PCP, regarding this patient.  He is agreeable to Tamoxifen therapy, knowing that she has a history of VTE.  HOWEVER, she is on anticoagulation with Xarelto and this will be continued.  RX for Tamoxifen 20 mg is escribed to her pharmacy.  Labs in 6-8 weeks: CBC diff, CMET.  Return in 6-8 weeks for follow-up and evaluation of tolerance to Tamoxifen.  Noncompliance Noncompliance with appointments.  Cancelled appointments since being seen last:  02/09/2016 12/29/2015   ORDERS PLACED FOR THIS ENCOUNTER: Orders Placed This Encounter  Procedures  . CBC with Differential  . Comprehensive metabolic panel    MEDICATIONS PRESCRIBED THIS ENCOUNTER: Meds ordered this encounter  Medications  . meclizine (ANTIVERT) 25 MG tablet    Sig: TAKE 1 TABLET BY MOUTH THREE TIMES DAILY AS NEEDED FOR DIZZINESS    Refill:  5  . tamoxifen (NOLVADEX) 20 MG tablet    Sig: Take 1 tablet (20 mg total) by mouth daily.    Dispense:  30 tablet    Refill:  1    Order Specific Question:   Supervising Provider    Answer:   Patrici Ranks [id number]    THERAPY PLAN:  NCCN guidelines recommends the following surveillance for invasive breast cancer (2.2017):  A. History and Physical exam 1-4 times per year as clinically appropriate for 5 years, then annually.  B. Periodic screening for changes in family history and referral to genetics counseling as indicated  C. Educate, monitor, and refer to lymphedema management.  D. Mammography every 12 months  E. Routine imaging of reconstructed breast is not indicated.  F. In the absence of clinical signs and symptoms suggestive of recurrent disease, there is no indication for laboratory or imaging studies for metastases screening.  G. Women on Tamoxifen: annual gynecologic assessment every 12 months if uterus is  present.  H. Women on aromatase inhibitor or who experience ovarian failure secondary to treatment should have monitoring of bone health with a bone mineral density determination at baseline and periodically thereafter.  I. Assess and encourage adherence to adjuvant endocrine therapy.  J. Evidence suggests that active lifestyle, healthy diet, limited alcohol intake, and achieving and maintaining  an ideal body weight (20-25 BMI) may lead to optimal breast cancer outcomes.  NCCN guidelines recommends monitoring for the following for those patients on Tamoxifen/Raloxifene Therapy:  A. Asymptomatic   1. Continue risk-reduction agent   2. Continue follow-up  B. Hot-flashes or other risk-reduction, agent related symptoms   1. Symptomatic treatment.    2. If persists, re-evaluate role of risk-reduction agent   3. Continue risk-reduction agent- Continue follow-up  C. Abnormal vaginal bleeding   1. Prompt evaluation for endometrial cancer if uterus intact    A. If endometrial pathology found, re-initiation of Tamoxifen may be considered after hysterectomy if early-stage disease     1. Continue follow-up    B. If no endometrial pathology (carcinoma or hyperplasia with or without atypia) found, continue Tamoxifen and re-evaluate if symptoms persist or recur.     1. Continue follow-up  D. Anticipated elective surgery   1. Consider discontinuing Tamoxifen or Raloxifene prior to elective surgery   2. Resume Tamoxifen or Raloxifene postoperatively when ambulation is normal  E. Deep vein thrombosis, pulmonary embolism, cerebrovascular accident, or prolonged immobilization   1. Discontinue tamoxifen or raloxifene, treat underlying condition.   All questions were answered. The patient knows to call the clinic with any problems, questions or concerns. We can certainly see the patient much sooner if necessary.  Patient and plan discussed with Dr. Ancil Linsey and she is in agreement with the  aforementioned.   This note is electronically signed by: Doy Mince 03/18/2016 4:12 PM

## 2016-04-05 DIAGNOSIS — Z79899 Other long term (current) drug therapy: Secondary | ICD-10-CM | POA: Diagnosis not present

## 2016-04-05 DIAGNOSIS — E119 Type 2 diabetes mellitus without complications: Secondary | ICD-10-CM | POA: Diagnosis not present

## 2016-04-05 DIAGNOSIS — I749 Embolism and thrombosis of unspecified artery: Secondary | ICD-10-CM | POA: Diagnosis not present

## 2016-04-11 DIAGNOSIS — C50919 Malignant neoplasm of unspecified site of unspecified female breast: Secondary | ICD-10-CM | POA: Diagnosis not present

## 2016-04-11 DIAGNOSIS — Z86718 Personal history of other venous thrombosis and embolism: Secondary | ICD-10-CM | POA: Diagnosis not present

## 2016-04-11 DIAGNOSIS — Z23 Encounter for immunization: Secondary | ICD-10-CM | POA: Diagnosis not present

## 2016-04-11 DIAGNOSIS — E785 Hyperlipidemia, unspecified: Secondary | ICD-10-CM | POA: Diagnosis not present

## 2016-04-11 DIAGNOSIS — E119 Type 2 diabetes mellitus without complications: Secondary | ICD-10-CM | POA: Diagnosis not present

## 2016-04-20 DIAGNOSIS — H2513 Age-related nuclear cataract, bilateral: Secondary | ICD-10-CM | POA: Diagnosis not present

## 2016-04-20 DIAGNOSIS — E119 Type 2 diabetes mellitus without complications: Secondary | ICD-10-CM | POA: Diagnosis not present

## 2016-05-09 ENCOUNTER — Encounter (HOSPITAL_COMMUNITY): Payer: Self-pay | Admitting: Oncology

## 2016-05-09 ENCOUNTER — Encounter (HOSPITAL_COMMUNITY): Payer: Medicare Other | Attending: Oncology | Admitting: Oncology

## 2016-05-09 ENCOUNTER — Encounter (HOSPITAL_COMMUNITY): Payer: Medicare Other

## 2016-05-09 VITALS — BP 122/60 | HR 65 | Resp 16 | Ht 64.0 in | Wt 168.0 lb

## 2016-05-09 DIAGNOSIS — Z923 Personal history of irradiation: Secondary | ICD-10-CM | POA: Diagnosis not present

## 2016-05-09 DIAGNOSIS — C50511 Malignant neoplasm of lower-outer quadrant of right female breast: Secondary | ICD-10-CM | POA: Insufficient documentation

## 2016-05-09 DIAGNOSIS — Z170 Estrogen receptor positive status [ER+]: Secondary | ICD-10-CM

## 2016-05-09 DIAGNOSIS — E1165 Type 2 diabetes mellitus with hyperglycemia: Secondary | ICD-10-CM

## 2016-05-09 DIAGNOSIS — Z8249 Family history of ischemic heart disease and other diseases of the circulatory system: Secondary | ICD-10-CM | POA: Insufficient documentation

## 2016-05-09 DIAGNOSIS — Z9889 Other specified postprocedural states: Secondary | ICD-10-CM | POA: Insufficient documentation

## 2016-05-09 LAB — COMPREHENSIVE METABOLIC PANEL
ALK PHOS: 50 U/L (ref 38–126)
ALT: 19 U/L (ref 14–54)
ANION GAP: 8 (ref 5–15)
AST: 20 U/L (ref 15–41)
Albumin: 3.6 g/dL (ref 3.5–5.0)
BILIRUBIN TOTAL: 0.5 mg/dL (ref 0.3–1.2)
BUN: 16 mg/dL (ref 6–20)
CALCIUM: 8.9 mg/dL (ref 8.9–10.3)
CO2: 23 mmol/L (ref 22–32)
CREATININE: 1 mg/dL (ref 0.44–1.00)
Chloride: 106 mmol/L (ref 101–111)
GFR, EST NON AFRICAN AMERICAN: 56 mL/min — AB (ref >60)
Glucose, Bld: 325 mg/dL — ABNORMAL HIGH (ref 65–99)
Potassium: 4.3 mmol/L (ref 3.5–5.1)
SODIUM: 137 mmol/L (ref 135–145)
TOTAL PROTEIN: 5.9 g/dL — AB (ref 6.5–8.1)

## 2016-05-09 LAB — CBC WITH DIFFERENTIAL/PLATELET
Basophils Absolute: 0 10*3/uL (ref 0.0–0.1)
Basophils Relative: 0 %
EOS ABS: 0.1 10*3/uL (ref 0.0–0.7)
Eosinophils Relative: 2 %
HEMATOCRIT: 37.8 % (ref 36.0–46.0)
HEMOGLOBIN: 12.5 g/dL (ref 12.0–15.0)
LYMPHS ABS: 1.5 10*3/uL (ref 0.7–4.0)
LYMPHS PCT: 27 %
MCH: 30.3 pg (ref 26.0–34.0)
MCHC: 33.1 g/dL (ref 30.0–36.0)
MCV: 91.7 fL (ref 78.0–100.0)
MONOS PCT: 10 %
Monocytes Absolute: 0.5 10*3/uL (ref 0.1–1.0)
NEUTROS PCT: 61 %
Neutro Abs: 3.3 10*3/uL (ref 1.7–7.7)
Platelets: 180 10*3/uL (ref 150–400)
RBC: 4.12 MIL/uL (ref 3.87–5.11)
RDW: 13.3 % (ref 11.5–15.5)
WBC: 5.4 10*3/uL (ref 4.0–10.5)

## 2016-05-09 NOTE — Assessment & Plan Note (Signed)
Stage IA (T1AN0M0) right lower-outer quadrant breast cancer, ER/PR+, HER2 negative, small primary measuring 0.5 cm and Oncotype unable to be performed.  S/P left lumpectomy by Dr. [name] on 05/06/2015 followed by adjuvant XRT.  She was then tried on Arimidex which she reported intolerance to secondary to dizziness.  She was then transitioned to Femara beginning on 11/26/2015 and the patient reports that she stopped this medication as well due to similar side effects in June 2017.  Normal baseline bone density on 09/23/2015.  Now on Tamoxifen beginning in mid-September 2017.  Oncology history is updated.  Labs today: CBC diff, CMET.  I personally reviewed and went over laboratory results with the patient.  The results are noted within this dictation.  Significant hyperglycemia is noted.  She reports compliance with her diabetic medications.  Continue Xarelto daily.  Continue Tamoxifen daily.   Labs in 12 weeks: CBC diff, CMET.  Return in 12 weeks for follow-up and ongoing evaluation of tolerance to Tamoxifen.

## 2016-05-09 NOTE — Progress Notes (Signed)
Olivia Mora, [name] [address]  Malignant neoplasm of lower-outer quadrant of right breast of female, estrogen receptor positive ([place]) - Plan: CBC with Differential, Comprehensive metabolic panel  CURRENT THERAPY: Intolerance to Arimidex and Femara.  INTERVAL HISTORY: Olivia Mora 70 y.o. 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  S/P left lumpectomy by Dr. [name] on 05/06/2015 followed by adjuvant XRT.  She was then tried on Arimidex which she reported intolerance to secondary to dizziness.  She was then transitioned to Femara beginning on 11/26/2015 and the patient reports that she stopped this medication as well due to similar side effects in June 2017.  Normal baseline bone density on 09/23/2015.    Breast cancer of lower-outer quadrant of right female breast ([place])   03/02/2015 Initial Diagnosis    Right breast biopsy 8:00: IDC with extracellular mucin, ER 100%, PR 50%, Ki-67 20%, grade 1 HER-2 negative ratio 1.11; left breast biopsy 4:00: Intraductal papilloma with UDH and fibrocystic changes      03/06/2015 Breast MRI    Right breast 8:00 position: 6 mm enhancing nodule with associated dilated duct and with blood; left breast 9:00 middle depth: 4 mm nodule, 4:00: 1 cm nodule nonspecific appearance      05/13/2015 Procedure    Left partial mastectomy by Dr. 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  T1AN0      06/30/2015 - 07/23/2015 Radiation Therapy    42.72 Gy at 2.67 Gy per fraction x 16 fractions by Dr. [name] in [facility]      08/28/2015 - 11/26/2015 Anti-estrogen oral therapy    Arimidex daily.      09/23/2015 Imaging    Bone density- BMD as determined from Femur Neck Left is 0.932 g/cm2 with a T-Score of -0.8. This patient  is considered normal according to [facility]) criteria.      10/26/2015 Imaging    MRI brain- negative for any acute intracranial abnormality or mass chronic left cerebellar infarcts.      11/25/2015 Adverse Reaction    Intolerance to Arimidex.      11/26/2015 - 12/21/2015 Anti-estrogen oral therapy    Femara      12/21/2015 Adverse Reaction    Dizziness and myalgias.      04/01/2016 -  Anti-estrogen oral therapy    Tamoxifen       She is doing well.  She notes tolerance to Tamoxifen thus far.  She denies any hot flashes, joint aches, and muscle aches.  She denies any vaginal bleeding.    "Why am I getting my labs checked so often?"  She is educated that when we start a new treatment, we like to keep a close eye on lab results.  She reports easy fatigability. We must be careful to blame this on her Tamoxifen as I do not have a good pulse of her baseline and this is her last treatment option.  Review of Systems  Constitutional: Negative.  Negative for chills, fever, malaise/fatigue and weight loss.  HENT: Negative.   Eyes: Negative.  Negative for blurred vision.  Respiratory: Negative.  Negative for cough and sputum production.   Cardiovascular: Negative.  Negative for chest pain.  Gastrointestinal: Negative.  Negative for constipation, diarrhea,  nausea and vomiting.  Genitourinary: Negative.  Negative for dysuria.  Musculoskeletal: Negative.  Negative for joint pain and myalgias.  Skin: Negative.  Negative for itching and rash.  Neurological: Negative.  Negative for weakness and headaches.  Endo/Heme/Allergies: Negative.   Psychiatric/Behavioral: Negative.     Past Medical History:  Diagnosis Date  . Abnormal EKG   . Anxiety   . Depression   . Diabetes ([place])   . DVT (deep vein thrombosis) in pregnancy ([place])   . GERD (gastroesophageal reflux disease)   . Hyperlipidemia   . Mitral regurgitation    mild/moderate, echo, 10/2011  . Noncompliance  03/17/2016  . Vertigo     Past Surgical History:  Procedure Laterality Date  . APPENDECTOMY    . BREAST LUMPECTOMY WITH RADIOACTIVE SEED AND SENTINEL LYMPH NODE BIOPSY Right 05/13/2015   Procedure:  RIGHT BREAST LUMPECTOMY WITH RADIOACTIVE SEED AND RIGHT SENTINEL LYMPH NODE BIOPSY;  Surgeon: [name], MD;  Location: [place];  Service: General;  Laterality: Right;  . BREAST LUMPECTOMY WITH RADIOACTIVE SEED LOCALIZATION Left 05/13/2015   Procedure: LEFT BREAST LUMPECTOMY WITH RADIOACTIVE SEED LOCALIZATION;  Surgeon: [name], MD;  Location: [place];  Service: General;  Laterality: Left;  . OTHER SURGICAL HISTORY     jaw  . OTHER SURGICAL HISTORY     cyst removal jaw  . TUBAL LIGATION      Family History  Problem Relation Age of Onset  . Heart attack Brother 50  . CAD      Several family memebers  . Heart disease      Several family memebers    Social History   Social History  . Marital status: Married    Spouse name: N/A  . Number of children: 2  . Years of education: N/A   Social History Main Topics  . Smoking status: Never Smoker  . Smokeless tobacco: Never Used  . Alcohol use No  . Drug use: No  . Sexual activity: Yes    Birth control/ protection: Post-menopausal   Other Topics Concern  . None   Social History Narrative  . None     PHYSICAL EXAMINATION  ECOG PERFORMANCE STATUS: 1 - Symptomatic but completely ambulatory  Vitals:   05/09/16 1400  BP: 122/60  Pulse: 65  Resp: 16    GENERAL:alert, no distress, well nourished, well developed, comfortable, cooperative and chronically ill appearing, unaccompanied, flat affect SKIN: skin color, texture, turgor are normal, no rashes or significant lesions HEAD: Normocephalic, No masses, lesions, tenderness or abnormalities EYES: normal, EOMI, Conjunctiva are pink and non-injected EARS: External ears normal OROPHARYNX:lips, buccal mucosa, and tongue normal and mucous  membranes are moist  NECK: supple, no adenopathy, trachea midline LYMPH:  no palpable lymphadenopathy BREAST:not examined LUNGS: clear to auscultation  HEART: regular rate & rhythm ABDOMEN:abdomen soft and normal bowel sounds BACK: Back symmetric, no curvature. EXTREMITIES:less then 2 second capillary refill, no joint deformities, effusion, or inflammation, no skin discoloration, no cyanosis  NEURO: alert & oriented x 3 with fluent speech, no focal motor/sensory deficits   LABORATORY DATA: CBC    Component Value Date/Time   WBC 5.4 05/09/2016 1359   RBC 4.12 05/09/2016 1359   HGB 12.5 05/09/2016 1359   HCT 37.8 05/09/2016 1359   PLT 180 05/09/2016 1359   MCV 91.7 05/09/2016 1359   MCH 30.3 05/09/2016 1359   MCHC 33.1 05/09/2016 1359   RDW 13.3 05/09/2016 1359   LYMPHSABS 1.5 05/09/2016 1359  MONOABS 0.5 05/09/2016 1359   EOSABS 0.1 05/09/2016 1359   BASOSABS 0.0 05/09/2016 1359      Chemistry      Component Value Date/Time   NA 137 05/09/2016 1359   K 4.3 05/09/2016 1359   CL 106 05/09/2016 1359   CO2 23 05/09/2016 1359   BUN 16 05/09/2016 1359   CREATININE 1.00 05/09/2016 1359      Component Value Date/Time   CALCIUM 8.9 05/09/2016 1359   ALKPHOS 50 05/09/2016 1359   AST 20 05/09/2016 1359   ALT 19 05/09/2016 1359   BILITOT 0.5 05/09/2016 1359        PENDING LABS:   RADIOGRAPHIC STUDIES:  No results found.   PATHOLOGY:    ASSESSMENT AND PLAN:  Breast cancer of lower-outer quadrant of right female breast ([place]) Stage IA (T1AN0M0) right lower-outer quadrant breast cancer, ER/PR+, HER2 negative, small primary measuring 0.5 cm and Oncotype unable to be performed.  S/P left lumpectomy by Dr. [name] on 05/06/2015 followed by adjuvant XRT.  She was then tried on Arimidex which she reported intolerance to secondary to dizziness.  She was then transitioned to Femara beginning on 11/26/2015 and the patient reports that she stopped this medication as well due to  similar side effects in June 2017.  Normal baseline bone density on 09/23/2015.  Now on Tamoxifen beginning in mid-September 2017.  Oncology history is updated.  Labs today: CBC diff, CMET.  I personally reviewed and went over laboratory results with the patient.  The results are noted within this dictation.  Significant hyperglycemia is noted.  She reports compliance with her diabetic medications.  Continue Xarelto daily.  Continue Tamoxifen daily.   Labs in 12 weeks: CBC diff, CMET.  Return in 12 weeks for follow-up and ongoing evaluation of tolerance to Tamoxifen.   ORDERS PLACED FOR THIS ENCOUNTER: Orders Placed This Encounter  Procedures  . CBC with Differential  . Comprehensive metabolic panel    MEDICATIONS PRESCRIBED THIS ENCOUNTER: No orders of the defined types were placed in this encounter.   THERAPY PLAN:  NCCN guidelines recommends the following surveillance for invasive breast cancer (2.2017):  A. History and Physical exam 1-4 times per year as clinically appropriate for 5 years, then annually.  B. Periodic screening for changes in family history and referral to genetics counseling as indicated  C. Educate, monitor, and refer to lymphedema management.  D. Mammography every 12 months  E. Routine imaging of reconstructed breast is not indicated.  F. In the absence of clinical signs and symptoms suggestive of recurrent disease, there is no indication for laboratory or imaging studies for metastases screening.  G. Women on Tamoxifen: annual gynecologic assessment every 12 months if uterus is present.  H. Women on aromatase inhibitor or who experience ovarian failure secondary to treatment should have monitoring of bone health with a bone mineral density determination at baseline and periodically thereafter.  I. Assess and encourage adherence to adjuvant endocrine therapy.  J. Evidence suggests that active lifestyle, healthy diet, limited alcohol intake, and achieving and  maintaining an ideal body weight (20-25 BMI) may lead to optimal breast cancer outcomes.  NCCN guidelines recommends monitoring for the following for those patients on Tamoxifen/Raloxifene Therapy:  A. Asymptomatic   1. Continue risk-reduction agent   2. Continue follow-up  B. Hot-flashes or other risk-reduction, agent related symptoms   1. Symptomatic treatment.    2. If persists, re-evaluate role of risk-reduction agent   3. Continue risk-reduction agent- Continue follow-up  C. Abnormal vaginal bleeding   1. Prompt evaluation for endometrial cancer if uterus intact    A. If endometrial pathology found, re-initiation of Tamoxifen may be considered after hysterectomy if early-stage disease     1. Continue follow-up    B. If no endometrial pathology (carcinoma or hyperplasia with or without atypia) found, continue Tamoxifen and re-evaluate if symptoms persist or recur.     1. Continue follow-up  D. Anticipated elective surgery   1. Consider discontinuing Tamoxifen or Raloxifene prior to elective surgery   2. Resume Tamoxifen or Raloxifene postoperatively when ambulation is normal  E. Deep vein thrombosis, pulmonary embolism, cerebrovascular accident, or prolonged immobilization   1. Discontinue tamoxifen or raloxifene, treat underlying condition.   All questions were answered. The patient knows to call the clinic with any problems, questions or concerns. We can certainly see the patient much sooner if necessary.  Patient and plan discussed with Dr. Ancil Linsey and she is in agreement with the aforementioned.   This note is electronically signed by: Doy Mince 05/09/2016 6:38 PM

## 2016-05-09 NOTE — Patient Instructions (Signed)
[place] at [facility] Discharge Instructions  RECOMMENDATIONS MADE BY THE CONSULTANT AND ANY TEST RESULTS WILL BE SENT TO YOUR REFERRING PHYSICIAN.  You were seen today by Kirby Crigler PA-C. Continue taking Tamoxifen daily . Follow up with Dr. Whitney Muse and labs in 12 weeks.    Thank you for choosing [place] at [facility] to provide your oncology and hematology care.  To afford each patient quality time with our provider, please arrive at least 15 minutes before your scheduled appointment time.   Beginning January 23rd 2017 lab work for the [facility] will be done in the  Main lab at [facility] on 1st floor. If you have a lab appointment with the [facility] please come in thru the  Main Entrance and check in at the main information desk  You need to re-schedule your appointment should you arrive 10 or more minutes late.  We strive to give you quality time with our providers, and arriving late affects you and other patients whose appointments are after yours.  Also, if you no show three or more times for appointments you may be dismissed from the clinic at the providers discretion.     Again, thank you for choosing [facility].  Our hope is that these requests will decrease the amount of time that you wait before being seen by our physicians.       _____________________________________________________________  Should you have questions after your visit to [facility], please contact our office at [phone] between the hours of 8:30 a.m. and 4:30 p.m.  Voicemails left after 4:30 p.m. will not be returned until the following business day.  For prescription refill requests, have your pharmacy contact our office.         Resources For Cancer Patients and their Caregivers ? American Cancer Society: Can assist with transportation, wigs, general needs, runs Look Good Feel Better.         ([phone] ? Cancer Care: Provides financial assistance, online support groups, medication/co-pay assistance.  1-800-813-HOPE (252)396-1610) ? [place] Assists [place] cancer patients and their families through emotional , educational and financial support.  ([phone] [phone] ? [place] DSS Where to apply for food stamps, Medicaid and utility assistance. 204-521-5734 ? RCATS: Transportation to medical appointments. ([phone] ? Social Security Administration: May apply for disability if have a Stage IV cancer. 234 136 8183 650-809-7239 ? [employer], Disability and Transit Services: Assists with nutrition, care and transit needs. [place] Support Programs: @10RELATIVEDAYS @ > Cancer Support Group  2nd Tuesday of the month 1pm-2pm, Journey Room  > Creative Journey  3rd Tuesday of the month 1130am-1pm, Journey Room  > Look Good Feel Better  1st Wednesday of the month 10am-12 noon, Journey Room (Call [facility] to register [phone])

## 2016-06-02 ENCOUNTER — Other Ambulatory Visit (HOSPITAL_COMMUNITY): Payer: Self-pay | Admitting: Oncology

## 2016-06-02 DIAGNOSIS — C50511 Malignant neoplasm of lower-outer quadrant of right female breast: Secondary | ICD-10-CM

## 2016-06-06 ENCOUNTER — Other Ambulatory Visit (HOSPITAL_COMMUNITY): Payer: Self-pay | Admitting: Oncology

## 2016-06-06 DIAGNOSIS — C50511 Malignant neoplasm of lower-outer quadrant of right female breast: Secondary | ICD-10-CM

## 2016-08-07 ENCOUNTER — Other Ambulatory Visit (HOSPITAL_COMMUNITY): Payer: Self-pay | Admitting: Oncology

## 2016-08-07 DIAGNOSIS — C50511 Malignant neoplasm of lower-outer quadrant of right female breast: Secondary | ICD-10-CM

## 2016-08-16 DIAGNOSIS — E119 Type 2 diabetes mellitus without complications: Secondary | ICD-10-CM | POA: Diagnosis not present

## 2016-08-18 ENCOUNTER — Encounter (HOSPITAL_COMMUNITY): Payer: Medicare Other | Attending: Oncology | Admitting: Oncology

## 2016-08-18 ENCOUNTER — Encounter (HOSPITAL_COMMUNITY): Payer: Self-pay

## 2016-08-18 ENCOUNTER — Encounter (HOSPITAL_COMMUNITY): Payer: Medicare Other | Attending: Hematology & Oncology

## 2016-08-18 VITALS — BP 130/64 | HR 64 | Temp 98.0°F | Resp 18 | Wt 166.4 lb

## 2016-08-18 DIAGNOSIS — Z170 Estrogen receptor positive status [ER+]: Secondary | ICD-10-CM | POA: Diagnosis not present

## 2016-08-18 DIAGNOSIS — N951 Menopausal and female climacteric states: Secondary | ICD-10-CM

## 2016-08-18 DIAGNOSIS — C50511 Malignant neoplasm of lower-outer quadrant of right female breast: Secondary | ICD-10-CM | POA: Diagnosis not present

## 2016-08-18 LAB — COMPREHENSIVE METABOLIC PANEL
ALBUMIN: 3.4 g/dL — AB (ref 3.5–5.0)
ALK PHOS: 46 U/L (ref 38–126)
ALT: 14 U/L (ref 14–54)
ANION GAP: 5 (ref 5–15)
AST: 17 U/L (ref 15–41)
BILIRUBIN TOTAL: 0.3 mg/dL (ref 0.3–1.2)
BUN: 14 mg/dL (ref 6–20)
CALCIUM: 9.1 mg/dL (ref 8.9–10.3)
CO2: 26 mmol/L (ref 22–32)
Chloride: 106 mmol/L (ref 101–111)
Creatinine, Ser: 0.89 mg/dL (ref 0.44–1.00)
GFR calc Af Amer: >60 mL/min (ref >60)
GLUCOSE: 279 mg/dL — AB (ref 65–99)
Potassium: 4.4 mmol/L (ref 3.5–5.1)
Sodium: 137 mmol/L (ref 135–145)
TOTAL PROTEIN: 5.8 g/dL — AB (ref 6.5–8.1)

## 2016-08-18 LAB — CBC WITH DIFFERENTIAL/PLATELET
BASOS PCT: 0 %
Basophils Absolute: 0 10*3/uL (ref 0.0–0.1)
Eosinophils Absolute: 0.1 10*3/uL (ref 0.0–0.7)
Eosinophils Relative: 2 %
HCT: 37 % (ref 36.0–46.0)
HEMOGLOBIN: 12.6 g/dL (ref 12.0–15.0)
Lymphocytes Relative: 34 %
Lymphs Abs: 1.8 10*3/uL (ref 0.7–4.0)
MCH: 31.2 pg (ref 26.0–34.0)
MCHC: 34.1 g/dL (ref 30.0–36.0)
MCV: 91.6 fL (ref 78.0–100.0)
MONO ABS: 0.5 10*3/uL (ref 0.1–1.0)
MONOS PCT: 10 %
NEUTROS ABS: 2.9 10*3/uL (ref 1.7–7.7)
NEUTROS PCT: 54 %
Platelets: 189 10*3/uL (ref 150–400)
RBC: 4.04 MIL/uL (ref 3.87–5.11)
RDW: 13.2 % (ref 11.5–15.5)
WBC: 5.4 10*3/uL (ref 4.0–10.5)

## 2016-08-18 MED ORDER — TAMOXIFEN CITRATE 20 MG PO TABS: 20.0000 mg | ORAL_TABLET | Freq: Every day | ORAL | 1 refills | Status: DC

## 2016-08-18 NOTE — Progress Notes (Signed)
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 Alphonsa Mora R breast with invasive grade 1 ductal carcinoma spanning 0.5 cm ER+ PR+ HER 2 neu -, pT1apN0 L breast negative for malignancy or atypia ONCOTYPE unable to be performed   Breast cancer of lower-outer quadrant of right female breast ([place])   03/02/2015 Initial Diagnosis    Right breast biopsy 8:00: IDC with extracellular mucin, ER 100%, PR 50%, Ki-67 20%, grade 1 HER-2 negative ratio 1.11; left breast biopsy 4:00: Intraductal papilloma with UDH and fibrocystic changes      03/06/2015 Breast MRI    Right breast 8:00 position: 6 mm enhancing nodule with associated dilated duct and with blood; left breast 9:00 middle depth: 4 mm nodule, 4:00: 1 cm nodule nonspecific appearance      05/13/2015 Procedure    Left partial mastectomy by Dr. Lucia Mora      05/13/2015 Pathology Results    Invasive ductal carcinoma of left breast, 0.5 cm, grade I, without LVI.  T1AN0      06/30/2015 - 07/23/2015 Radiation Therapy    42.72 Gy at 2.67 Gy per fraction x 16 fractions by Dr. Pablo Mora in [facility].      08/28/2015 - 11/26/2015 Anti-estrogen oral therapy    Arimidex daily.      09/23/2015 Imaging    Bone density- BMD as determined from Femur Neck Left is 0.932 g/cm2 with a T-Score of -0.8. This patient is considered normal according to [facility]) criteria.      10/26/2015 Imaging      MRI brain- negative for any acute intracranial abnormality or mass chronic left cerebellar infarcts.      11/25/2015 Adverse Reaction    Intolerance to Arimidex.      11/26/2015 - 12/21/2015 Anti-estrogen oral therapy    Femara      12/21/2015 Adverse Reaction    Dizziness and myalgias.      04/01/2016 -  Anti-estrogen oral therapy    Tamoxifen       HISTORY OF PRESENTING ILLNESS:  Olivia Mora 71 y.o. female is here for follow-up of Right breast invasive ductal carcinoma. She originally presented with complaints of nipple discharge.   She saw Dr. Elonda Mora in consultation. She underwent a mammogram in May of this year and a ductogram was recommended. The ductogram was attempted on 01/08/2015 but was unable to be performed. Consultation with a breast surgeon was recommended.   She saw Dr. Lucia Mora and a breast MRI was ordered. In the right breast at 8:00 there was an enhancing nodule with associated dilated duct filled with sanguinous material. In the left breast at the 9:00 position there was a progressively enhancing nodule, and at the 4:00 position enhancement with a nonspecific appearance.   Right breast biopsy 04/02/2015: 8:00: IDC with extracellular mucin, ER 100%, PR 50%, Ki-67 20%, grade 1 HER-2 negative ratio 1.11, T1b N0 stage I a clinical stage; left breast biopsy 4:00: Intraductal papilloma with UDH and fibrocystic changes.   Ms. Olivia Mora returns to the [facility] alone today for  continued follow up of her breast cancer .  States she has been doing pretty good.  She states she has been getting hot flashes with taking her tamoxifen. She notes the hot flashes while taking tamoxifen are tolerable.   Denies feeling any new breast masses.  States she hasn't had a mammogram done in a long time because "they hurt me so bad".  States she gets her eye examinated routinely every year.    In terms of breast cancer risk profile:  She menarched at early age of 83 and went to  menopause at age 30  She had 2 pregnancy, her first child was born at age 27  She has received birth control pills for approximately 9 years.  She was exposed to hormone replacement therapy for 20 years  She has no family history of Breast/GYN/GI cancer  MEDICAL HISTORY:  Past Medical History:  Diagnosis Date  . Abnormal EKG   . Anxiety   . Depression   . Diabetes ([place])   . DVT (deep vein thrombosis) in pregnancy ([place])   . GERD (gastroesophageal reflux disease)   . Hyperlipidemia   . Mitral regurgitation    mild/moderate, echo, 10/2011  . Noncompliance 03/17/2016  . Vertigo     SURGICAL HISTORY: Past Surgical History:  Procedure Laterality Date  . APPENDECTOMY    . BREAST LUMPECTOMY WITH RADIOACTIVE SEED AND SENTINEL LYMPH NODE BIOPSY Right 05/13/2015   Procedure:  RIGHT BREAST LUMPECTOMY WITH RADIOACTIVE SEED AND RIGHT SENTINEL LYMPH NODE BIOPSY;  Surgeon: Olivia Overall, MD;  Location: [facility];  Service: General;  Laterality: Right;  . BREAST LUMPECTOMY WITH RADIOACTIVE SEED LOCALIZATION Left 05/13/2015   Procedure: LEFT BREAST LUMPECTOMY WITH RADIOACTIVE SEED LOCALIZATION;  Surgeon: Olivia Overall, MD;  Location: [facility] [place];  Service: General;  Laterality: Left;  . OTHER SURGICAL HISTORY     jaw  . OTHER SURGICAL HISTORY     cyst removal jaw  . TUBAL LIGATION      SOCIAL HISTORY: Social History   Social History  . Marital status: Married    Spouse name: N/A  . Number of children: 2  . Years of education: N/A   Occupational History  . Not on file.   Social History Main Topics  . Smoking status: Never Smoker  . Smokeless tobacco: Never Used  . Alcohol use No  . Drug use: No  . Sexual activity: Yes    Birth control/ protection: Post-menopausal   Other Topics Concern  . Not on file   Social History Narrative  . No narrative on file  Resides in [place] Married, caretaker of husband who had a stroke that occurred 2-3 months ago  effecting his memory.  He also has a cardiac condition  2 children, 5 grandchildren Never smoked EtOH, none Previously employed for a [employer]  FAMILY HISTORY: Family History  Problem Relation Age of Onset  . Heart attack Brother 76  . CAD      Several family memebers  . Heart disease      Several family memebers    ALLERGIES:  has No Known Allergies.  MEDICATIONS:  Current Outpatient Prescriptions  Medication Sig Dispense Refill  . ALPRAZolam (XANAX) 1 MG tablet Take 1 mg by mouth at bedtime as needed for sleep. Reported on 11/26/2015    . [place]co 5-2.5-18.5 injection Reported on 11/26/2015    . cetirizine (ZYRTEC) 10 MG tablet Take 10 mg by mouth daily.    Marland Kitchen  cholecalciferol (VITAMIN D) 1000 units tablet Take 1,000 Units by mouth daily.    . clonazePAM (KLONOPIN) 1 MG tablet Take 1 mg by mouth 4 (four) times daily.    . cyanocobalamin (,VITAMIN B-12,) 1000 MCG/ML injection INJECT EVERY 4 WEEKS AS DIRECTED  6  . Cyanocobalamin (VITAMIN B-12 IJ) Inject 1 mL as directed every 30 (thirty) days.     Marland Kitchen glipiZIDE (GLUCOTROL) 10 MG tablet Take 10 mg by mouth 2 (two) times daily before a meal.    . HYDROcodone-acetaminophen (NORCO/VICODIN) 5-325 MG tablet Take 1-2 tablets by mouth every 6 (six) hours as needed. 30 tablet 0  . hydrOXYzine (ATARAX/VISTARIL) 10 MG tablet Take 10 mg by mouth daily. Reported on 11/26/2015    . insulin glargine (LANTUS) 100 UNIT/ML injection Inject 30 Units into the skin daily.     Marland Kitchen LANTUS SOLOSTAR 100 UNIT/ML Solostar Pen INJECT 30 UNITS EVERY MORNING  12  . letrozole (FEMARA) 2.5 MG tablet TAKE 1 TABLET (2.5 MG TOTAL) BY MOUTH DAILY. (Patient not taking: Reported on 03/17/2016) 30 tablet 0  . meclizine (ANTIVERT) 25 MG tablet TAKE 1 TABLET BY MOUTH THREE TIMES DAILY AS NEEDED FOR DIZZINESS  5  . meclizine (ANTIVERT) 50 MG tablet Take 0.5 tablets (25 mg total) by mouth 3 (three) times daily as needed for dizziness. 30 tablet 0  . metFORMIN  (GLUCOPHAGE) 1000 MG tablet Take 500 mg by mouth daily. Alternates 1-2 a day    . Multiple Minerals-Vitamins (CALCIUM & VIT D3 BONE HEALTH PO) Take 1 tablet by mouth daily.    Marland Kitchen PARoxetine (PAXIL) 20 MG tablet Take 20 mg by mouth daily.    . ranitidine (ZANTAC) 150 MG tablet Take 150 mg by mouth as needed for heartburn. Reported on 11/26/2015    . sertraline (ZOLOFT) 50 MG tablet Take 50 mg by mouth daily. Reported on 11/26/2015    . simvastatin (ZOCOR) 40 MG tablet Take 40 mg by mouth every evening. Reported on 09/25/2015    . tamoxifen (NOLVADEX) 20 MG tablet TAKE 1 TABLET (20 MG TOTAL) BY MOUTH DAILY. 30 tablet 1  . traZODone (DESYREL) 100 MG tablet Take 100 mg by mouth at bedtime.    Alveda Reasons 20 MG TABS tablet Take 20 mg by mouth daily.  12   No current facility-administered medications for this visit.      Review of Systems  Constitutional: Negative.        Hot flashes associated with taking tamoxifen. Notes the hot flashes are tolerable.   HENT: Negative.   Eyes: Negative.   Respiratory: Negative.   Cardiovascular: Negative.   Gastrointestinal: Negative.   Genitourinary: Negative.   Musculoskeletal: Negative.   Skin: Negative.   Neurological: Negative.   Endo/Heme/Allergies: Negative.   Psychiatric/Behavioral: Negative.   All other systems reviewed and are negative.    PHYSICAL EXAMINATION: ECOG PERFORMANCE STATUS: 1 - Symptomatic but completely ambulatory  Vitals:   08/18/16 1408  BP: 130/64  Pulse: 64  Resp: 18  Temp: 98 F (36.7 C)   Filed Weights   08/18/16 1408  Weight: 166 lb 6.4 oz (75.5 kg)     Physical Exam  Constitutional: She is oriented to person, place, and time.  HENT:  Head: Normocephalic and atraumatic.  Eyes: Conjunctivae and EOM are normal. Pupils are equal, round, and reactive to light.  Neck: Normal range of motion. Neck supple.  Cardiovascular: Normal rate, regular rhythm and normal heart sounds.   Pulmonary/Chest: Effort normal and  breath  sounds normal.  No masses were felt in bilateral breasts, no nipple discharge/inversion, no axillary lymphadenopathy bilaterally.  Abdominal: Soft. Bowel sounds are normal.  Musculoskeletal: Normal range of motion.  Neurological: She is alert and oriented to person, place, and time. Gait normal.  Skin: Skin is warm and dry.  Nursing note and vitals reviewed.   LABORATORY DATA:  I have reviewed the data as listed  Results for HESPER, VENTURELLA (MRN [id number]) as of 08/18/2016 11:22  Ref. Range 05/09/2016 13:59  Sodium Latest Ref Range: 135 - 145 mmol/L 137  Potassium Latest Ref Range: 3.5 - 5.1 mmol/L 4.3  Chloride Latest Ref Range: 101 - 111 mmol/L 106  CO2 Latest Ref Range: 22 - 32 mmol/L 23  Glucose Latest Ref Range: 65 - 99 mg/dL 325 (H)  BUN Latest Ref Range: 6 - 20 mg/dL 16  Creatinine Latest Ref Range: 0.44 - 1.00 mg/dL 1.00  Calcium Latest Ref Range: 8.9 - 10.3 mg/dL 8.9  Anion gap Latest Ref Range: 5 - 15  8  Alkaline Phosphatase Latest Ref Range: 38 - 126 U/L 50  Albumin Latest Ref Range: 3.5 - 5.0 g/dL 3.6  AST Latest Ref Range: 15 - 41 U/L 20  ALT Latest Ref Range: 14 - 54 U/L 19  Total Protein Latest Ref Range: 6.5 - 8.1 g/dL 5.9 (L)  Total Bilirubin Latest Ref Range: 0.3 - 1.2 mg/dL 0.5  EGFR (African American) Latest Ref Range: >60 mL/min >60  EGFR (Non-African Amer.) Latest Ref Range: >60 mL/min 56 (L)  WBC Latest Ref Range: 4.0 - 10.5 K/uL 5.4  RBC Latest Ref Range: 3.87 - 5.11 MIL/uL 4.12  Hemoglobin Latest Ref Range: 12.0 - 15.0 g/dL 12.5  HCT Latest Ref Range: 36.0 - 46.0 % 37.8  MCV Latest Ref Range: 78.0 - 100.0 fL 91.7  MCH Latest Ref Range: 26.0 - 34.0 pg 30.3  MCHC Latest Ref Range: 30.0 - 36.0 g/dL 33.1  RDW Latest Ref Range: 11.5 - 15.5 % 13.3  Platelets Latest Ref Range: 150 - 400 K/uL 180  Neutrophils Latest Units: % 61  Lymphocytes Latest Units: % 27  Monocytes Relative Latest Units: % 10  Eosinophil Latest Units: % 2  Basophil Latest  Units: % 0  NEUT# Latest Ref Range: 1.7 - 7.7 K/uL 3.3  Lymphocyte # Latest Ref Range: 0.7 - 4.0 K/uL 1.5  Monocyte # Latest Ref Range: 0.1 - 1.0 K/uL 0.5  Eosinophils Absolute Latest Ref Range: 0.0 - 0.7 K/uL 0.1  Basophils Absolute Latest Ref Range: 0.0 - 0.1 K/uL 0.0   RADIOGRAPHIC STUDIES: I have personally reviewed the radiological reports and agreed with the findings in the report.  Study Result     CLINICAL DATA: Aphasia. History of breast cancer.  EXAM: MRI HEAD WITHOUT AND WITH CONTRAST  TECHNIQUE: Multiplanar, multiecho pulse sequences of the brain and surrounding structures were obtained without and with intravenous contrast.  CONTRAST: 64m MULTIHANCE GADOBENATE DIMEGLUMINE 529 MG/ML IV SOLN  COMPARISON: 10/15/2014  FINDINGS: There is no evidence of acute infarct, intracranial hemorrhage, mass, midline shift, or extra-axial fluid collection. Ventricles and sulci are within normal limits for age. Scattered, punctate foci of T2 hyperintensity in the cerebral white matter bilaterally are unchanged and nonspecific but compatible with minimal chronic small vessel ischemic disease, less than is often seen in patients of this age. Small, chronic infarcts are again seen in the left cerebellum. No abnormal enhancement is identified.  Orbits are unremarkable. Paranasal sinuses and mastoid air cells are  clear. Major intracranial vascular flow voids are preserved. No suspicious skull lesions are seen.  IMPRESSION: 1. No acute intracranial abnormality or mass. 2. Chronic left cerebellar infarcts.   Electronically Signed  By: Logan Bores M.D.  On: 09/25/2015 19:03    Study Result     EXAM: DUAL X-RAY ABSORPTIOMETRY (DXA) FOR BONE MINERAL DENSITY  IMPRESSION: Ordering Physician: Dr. Patrici Mora,  Your patient Olivia Mora completed a BMD test on 09/23/2015 using the [place] (software version: 14.10) manufactured by  [employer]. The following summarizes the results of our evaluation. PATIENT BIOGRAPHICAL: Name: KOULA, VENIER Patient ID: [id number] Birth Date: 10-03-1945 Height: 64.0 in. Gender: Female Exam Date: 09/23/2015 Weight: 160.0 lbs. Indications: Breast Ca, Caucasian, History of Fracture (Adult), Post Menopausal Fractures: Finger Treatments: Arimidex, Calcium, Vitamin D DENSITOMETRY RESULTS: Site Region Measured Date Measured Age WHO Classification Young Adult T-score BMD %Change vs. Previous Significant Change (*) AP Spine L2-L4 09/23/2015 69.6 Normal 0.4 1.249 g/cm2 -3.0% Yes AP Spine L2-L4 03/09/2007 61.0 Normal 0.7 1.287 g/cm2 - -  DualFemur Neck Left 09/23/2015 69.6 Normal -0.8 0.932 g/cm2 -2.8% - DualFemur Neck Left 03/09/2007 61.0 Normal -0.6 0.959 g/cm2 - - ASSESSMENT: BMD as determined from Femur Neck Left is 0.932 g/cm2 with a T-Score of -0.8. This patient is considered normal according to [facility]) criteria. Compared with the prior study on 03/09/2007, the BMD of the lumbar spine shows a statistically significant decrease. Compared with the prior study on 03/09/2007, the BMD of the left femoral neck shows no statistically significant change. (L-1 was excluded due to advanced degenerative changes.) (Patient does not meet criteria for FRAX assessment.)  World Health Organization [facility]) criteria for post-menopausal, Caucasian Women: Normal: T-score at or above -1 SD Osteopenia: T-score between -1 and -2.5 SD Osteoporosis: T-score at or below -2.5 SD  RECOMMENDATIONS: [place] recommends that FDA-approved medial therapies be considered in postmenopausal women and men age 57 or older with a: 1. Hip or vertebral (clinical or morphometric) fracture. 2. T-Score of < -2.5 at the spine or hip. 3. Ten-year fracture probability by FRAX of 3% or greater for hip fracture or 20% or greater for  major osteoporotic fracture.  All treatment decisions require clinical judgment and consideration of individual patient factors, including patient preferences, co-morbidities, previous drug use, risk factors not captured in the FRAX model (e.g. falls, vitamin D deficiency, increased bone turnover, interval significant decline in bone density) and possible under-or over-estimation of fracture risk by FRAX.  All patients should ensure an adequate intake of dietary calcium (1200 mg/d) and vitamin D (800 IU daily) unless contraindicated.  FOLLOW-UP: People with diagnosed cases of osteoporosis or osteopenia should be regularly tested for bone mineral density. For patients eligible for Medicare, routine testing is allowed once every 2 years. Testing frequency can be increased for patients who have rapidly progressing disease, or for those who are receiving medical therapy to restore bone mass.  I have reviewed this report, and agree with the above findings.  [facility]   Electronically Signed  By: David Martinique M.D.  On: 09/23/2015 12:32   PATHOLOGY:         ASSESSMENT AND PLAN:  Breast cancer of lower-outer quadrant of right female breast ([place]) ER+ PR+ HER 2 neu -, pT1apN0, small primary at 0.5 cm L breast biopsy negative ONCOTYPE unable to be performed Adjuvant XRT  Endocrine therapy with Tamoxifen DEXA 09/23/2015 normal bone density    Clinically  NED on breast exam today.  Continue tamoxifen, tolerating well. Advised her to notify me asap if she starts having any unexplained calf pain/swelling, pleuritic chest pain, shortness of breath, given her history of thromboembolic events. Advised her to continue annual eye exams. Advised her to notify me if she has any abnormal vaginal bleeding.  Refille for tamoxifen #90 given today with 1 refill.  Ordered bilateral diagnostic mammogram today.  RTC in 6 months for follow up. Sooner should she have  any problems or notes any changes on her breasts.   All questions were answered. The patient knows to call the clinic with any problems, questions or concerns.   This document serves as a record of services personally performed by Twana First, MD. It was created on her behalf by Shirlean Mylar, a trained medical scribe. The creation of this record is based on the scribe's personal observations and the provider's statements to them. This document has been checked and approved by the attending provider.  I have reviewed the above documentation for accuracy and completeness, and I agree with the above.  This note was electronically signed.

## 2016-08-18 NOTE — Patient Instructions (Signed)
Olivia Mora at [facility] Discharge Instructions  RECOMMENDATIONS MADE BY THE CONSULTANT AND ANY TEST RESULTS WILL BE SENT TO YOUR REFERRING PHYSICIAN.  You were seen today by Dr. Barron Schmid We will schedule you for your mammogram Follow up in 6 months with labs  Thank you for choosing [place] at [facility] to provide your oncology and hematology care.  To afford each patient quality time with our provider, please arrive at least 15 minutes before your scheduled appointment time.    If you have a lab appointment with the [facility] please come in thru the  Main Entrance and check in at the main information desk  You need to re-schedule your appointment should you arrive 10 or more minutes late.  We strive to give you quality time with our providers, and arriving late affects you and other patients whose appointments are after yours.  Also, if you no show three or more times for appointments you may be dismissed from the clinic at the providers discretion.     Again, thank you for choosing [facility].  Our hope is that these requests will decrease the amount of time that you wait before being seen by our physicians.       _____________________________________________________________  Should you have questions after your visit to [facility], please contact our office at [phone] between the hours of 8:30 a.m. and 4:30 p.m.  Voicemails left after 4:30 p.m. will not be returned until the following business day.  For prescription refill requests, have your pharmacy contact our office.       Resources For Cancer Patients and their Caregivers ? American Cancer Society: Can assist with transportation, wigs, general needs, runs Look Good Feel Better.        301-647-5545 ? Cancer Care: Provides financial assistance, online support groups, medication/co-pay assistance.  1-800-813-HOPE 331-233-7057) ? [place] Assists [place] cancer patients and their families through emotional , educational and financial support.  747-282-6413 ? [place] DSS Where to apply for food stamps, Medicaid and utility assistance. 780-337-5587 ? RCATS: Transportation to medical appointments. 6604769042 ? Social Security Administration: May apply for disability if have a Stage IV cancer. 901 301 5635 (612)495-6766 ? [employer], Disability and Transit Services: Assists with nutrition, care and transit needs. [place] Support Programs: @10RELATIVEDAYS @ > Cancer Support Group  2nd Tuesday of the month 1pm-2pm, Journey Room  > Creative Journey  3rd Tuesday of the month 1130am-1pm, Journey Room  > Look Good Feel Better  1st Wednesday of the month 10am-12 noon, Journey Room (Call [place] to register [phone])

## 2016-08-22 ENCOUNTER — Other Ambulatory Visit: Payer: Self-pay | Admitting: Oncology

## 2016-08-22 DIAGNOSIS — Z853 Personal history of malignant neoplasm of breast: Secondary | ICD-10-CM

## 2016-08-23 DIAGNOSIS — E119 Type 2 diabetes mellitus without complications: Secondary | ICD-10-CM | POA: Diagnosis not present

## 2016-08-23 DIAGNOSIS — C50511 Malignant neoplasm of lower-outer quadrant of right female breast: Secondary | ICD-10-CM | POA: Diagnosis not present

## 2016-08-31 ENCOUNTER — Ambulatory Visit (INDEPENDENT_AMBULATORY_CARE_PROVIDER_SITE_OTHER): Payer: Medicare Other | Admitting: Orthopaedic Surgery

## 2016-08-31 DIAGNOSIS — B349 Viral infection, unspecified: Secondary | ICD-10-CM | POA: Diagnosis not present

## 2016-08-31 DIAGNOSIS — R918 Other nonspecific abnormal finding of lung field: Secondary | ICD-10-CM | POA: Diagnosis not present

## 2016-08-31 DIAGNOSIS — R6883 Chills (without fever): Secondary | ICD-10-CM | POA: Diagnosis not present

## 2016-08-31 DIAGNOSIS — R531 Weakness: Secondary | ICD-10-CM | POA: Diagnosis not present

## 2016-08-31 DIAGNOSIS — E860 Dehydration: Secondary | ICD-10-CM | POA: Diagnosis not present

## 2016-09-20 ENCOUNTER — Encounter (HOSPITAL_COMMUNITY): Payer: Medicare Other

## 2016-09-21 ENCOUNTER — Ambulatory Visit (INDEPENDENT_AMBULATORY_CARE_PROVIDER_SITE_OTHER): Payer: Medicare Other | Admitting: Orthopaedic Surgery

## 2016-10-26 ENCOUNTER — Ambulatory Visit (INDEPENDENT_AMBULATORY_CARE_PROVIDER_SITE_OTHER): Payer: Medicare Other | Admitting: Orthopaedic Surgery

## 2016-12-27 DIAGNOSIS — E119 Type 2 diabetes mellitus without complications: Secondary | ICD-10-CM | POA: Diagnosis not present

## 2017-01-03 DIAGNOSIS — E11638 Type 2 diabetes mellitus with other oral complications: Secondary | ICD-10-CM | POA: Diagnosis not present

## 2017-01-03 DIAGNOSIS — R42 Dizziness and giddiness: Secondary | ICD-10-CM | POA: Diagnosis not present

## 2017-01-03 DIAGNOSIS — E119 Type 2 diabetes mellitus without complications: Secondary | ICD-10-CM | POA: Diagnosis not present

## 2017-02-13 ENCOUNTER — Encounter (HOSPITAL_COMMUNITY): Payer: Medicare Other | Attending: Oncology

## 2017-02-13 DIAGNOSIS — C50511 Malignant neoplasm of lower-outer quadrant of right female breast: Secondary | ICD-10-CM | POA: Insufficient documentation

## 2017-02-13 LAB — CBC WITH DIFFERENTIAL/PLATELET
Basophils Absolute: 0 10*3/uL (ref 0.0–0.1)
Basophils Relative: 0 %
Eosinophils Absolute: 0 10*3/uL (ref 0.0–0.7)
Eosinophils Relative: 1 %
HCT: 40.3 % (ref 36.0–46.0)
Hemoglobin: 13 g/dL (ref 12.0–15.0)
LYMPHS ABS: 2 10*3/uL (ref 0.7–4.0)
Lymphocytes Relative: 38 %
MCH: 31.5 pg (ref 26.0–34.0)
MCHC: 32.3 g/dL (ref 30.0–36.0)
MCV: 97.6 fL (ref 78.0–100.0)
MONO ABS: 0.4 10*3/uL (ref 0.1–1.0)
Monocytes Relative: 8 %
NEUTROS ABS: 2.8 10*3/uL (ref 1.7–7.7)
Neutrophils Relative %: 53 %
Platelets: 168 10*3/uL (ref 150–400)
RBC: 4.13 MIL/uL (ref 3.87–5.11)
RDW: 14 % (ref 11.5–15.5)
WBC: 5.2 10*3/uL (ref 4.0–10.5)

## 2017-02-13 LAB — COMPREHENSIVE METABOLIC PANEL
ALBUMIN: 3.5 g/dL (ref 3.5–5.0)
ALK PHOS: 44 U/L (ref 38–126)
ALT: 21 U/L (ref 14–54)
AST: 26 U/L (ref 15–41)
Anion gap: 12 (ref 5–15)
BUN: 14 mg/dL (ref 6–20)
CO2: 22 mmol/L (ref 22–32)
CREATININE: 1 mg/dL (ref 0.44–1.00)
Calcium: 9 mg/dL (ref 8.9–10.3)
Chloride: 105 mmol/L (ref 101–111)
GFR calc Af Amer: >60 mL/min (ref >60)
GFR calc non Af Amer: 55 mL/min — ABNORMAL LOW (ref >60)
GLUCOSE: 313 mg/dL — AB (ref 65–99)
Potassium: 4.2 mmol/L (ref 3.5–5.1)
SODIUM: 139 mmol/L (ref 135–145)
Total Bilirubin: 0.6 mg/dL (ref 0.3–1.2)
Total Protein: 5.9 g/dL — ABNORMAL LOW (ref 6.5–8.1)

## 2017-02-14 ENCOUNTER — Other Ambulatory Visit (HOSPITAL_COMMUNITY): Payer: Medicare Other

## 2017-02-17 ENCOUNTER — Encounter (HOSPITAL_COMMUNITY): Payer: Medicare Other | Attending: Oncology | Admitting: Oncology

## 2017-02-17 ENCOUNTER — Encounter (HOSPITAL_COMMUNITY): Payer: Self-pay

## 2017-02-17 VITALS — BP 111/61 | HR 55 | Resp 16 | Ht 63.5 in | Wt 165.8 lb

## 2017-02-17 DIAGNOSIS — Z170 Estrogen receptor positive status [ER+]: Secondary | ICD-10-CM

## 2017-02-17 DIAGNOSIS — N951 Menopausal and female climacteric states: Secondary | ICD-10-CM | POA: Diagnosis not present

## 2017-02-17 DIAGNOSIS — C50511 Malignant neoplasm of lower-outer quadrant of right female breast: Secondary | ICD-10-CM | POA: Diagnosis not present

## 2017-02-17 DIAGNOSIS — Z86718 Personal history of other venous thrombosis and embolism: Secondary | ICD-10-CM

## 2017-02-17 MED ORDER — VENLAFAXINE HCL ER 75 MG PO CP24: 75.0000 mg | ORAL_CAPSULE | Freq: Every day | ORAL | 4 refills | Status: DC

## 2017-02-17 NOTE — Progress Notes (Signed)
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 [name] R breast with invasive grade 1 ductal carcinoma spanning 0.5 cm ER+ PR+ HER 2 neu -, pT1apN0 L breast negative for malignancy or atypia ONCOTYPE unable to be performed   Breast cancer of lower-outer quadrant of right female breast ([place])   03/02/2015 Initial Diagnosis    Right breast biopsy 8:00: IDC with extracellular mucin, ER 100%, PR 50%, Ki-67 20%, grade 1 HER-2 negative ratio 1.11; left breast biopsy 4:00: Intraductal papilloma with UDH and fibrocystic changes      03/06/2015 Breast MRI    Right breast 8:00 position: 6 mm enhancing nodule with associated dilated duct and with blood; left breast 9:00 middle depth: 4 mm nodule, 4:00: 1 cm nodule nonspecific appearance      05/13/2015 Procedure    Left partial mastectomy by Dr. 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  T1AN0      06/30/2015 - 07/23/2015 Radiation Therapy    42.72 Gy at 2.67 Gy per fraction x 16 fractions by Dr. [name] in [facility].      08/28/2015 - 11/26/2015 Anti-estrogen oral therapy    Arimidex daily.      09/23/2015 Imaging    Bone density- BMD as determined from Femur Neck Left is 0.932 g/cm2 with a T-Score of -0.8. This patient is considered normal according to [facility]) criteria.      10/26/2015 Imaging    MRI brain- negative for any acute intracranial abnormality or mass chronic left cerebellar infarcts.      11/25/2015 Adverse Reaction    Intolerance to Arimidex.      11/26/2015 - 12/21/2015 Anti-estrogen oral therapy    Femara      12/21/2015 Adverse Reaction    Dizziness and myalgias.      04/01/2016 -  Anti-estrogen oral therapy    Tamoxifen       HISTORY OF PRESENTING ILLNESS:  Olivia Mora 71 y.o. female is here for follow-up of Right breast invasive ductal carcinoma. She originally presented with complaints of nipple discharge.   She saw Dr. Elonda Husky in consultation. She underwent a mammogram in May of this year and a ductogram was recommended. The ductogram was attempted on 01/08/2015 but was unable to be performed. Consultation with a breast surgeon was recommended.   She saw Dr. [name] and a breast MRI was ordered. In the right breast at 8:00 there was an enhancing nodule with associated dilated duct filled with sanguinous material. In the left breast at the 9:00 position there was a progressively enhancing nodule, and at the 4:00 position enhancement with a nonspecific appearance.   Right breast biopsy 04/02/2015: 8:00: IDC with extracellular mucin, ER 100%, PR 50%, Ki-67 20%, grade 1 HER-2 negative ratio 1.11, T1b N0 stage I a clinical stage; left breast biopsy 4:00: Intraductal papilloma with UDH and fibrocystic changes.   Olivia Mora returns to the [facility] alone today for continued  follow up of her breast cancer .  States she has been doing pretty good.  She states she has been getting hot flashes with taking her tamoxifen, but they are tolerable.  She has not gotten her mammogram as she was supposed to in February of this year.   She states that she wants to eventually get her teeth pulled and dentures put in and wondered what she needed to do with her chronic anticoagulation.   MEDICAL HISTORY:  Past Medical History:  Diagnosis Date  .  Abnormal EKG   . Anxiety   . Depression   . Diabetes ([place])   . DVT (deep vein thrombosis) in pregnancy ([place])   . GERD (gastroesophageal reflux disease)   . Hyperlipidemia   . Mitral regurgitation    mild/moderate, echo, 10/2011  . Noncompliance 03/17/2016  . Vertigo    In terms of breast cancer risk profile:  She menarched at early age of 41 and went to menopause at age 73  She had 2 pregnancy, her first child was born at age 39  She has received birth control pills for approximately 9 years.  She was exposed to hormone replacement therapy for 20 years  She has no family history of Breast/GYN/GI cancer  SURGICAL HISTORY: Past Surgical History:  Procedure Laterality Date  . APPENDECTOMY    . BREAST LUMPECTOMY WITH RADIOACTIVE SEED AND SENTINEL LYMPH NODE BIOPSY Right 05/13/2015   Procedure:  RIGHT BREAST LUMPECTOMY WITH RADIOACTIVE SEED AND RIGHT SENTINEL LYMPH NODE BIOPSY;  Surgeon: [name], MD;  Location: [facility];  Service: General;  Laterality: Right;  . BREAST LUMPECTOMY WITH RADIOACTIVE SEED LOCALIZATION Left 05/13/2015   Procedure: LEFT BREAST LUMPECTOMY WITH RADIOACTIVE SEED LOCALIZATION;  Surgeon: [name], MD;  Location: [facility];  Service: General;  Laterality: Left;  . OTHER SURGICAL HISTORY     jaw  . OTHER SURGICAL HISTORY     cyst removal jaw  . TUBAL LIGATION      SOCIAL HISTORY: Social History   Social History  . Marital status: Married    Spouse name: Olivia Mora  . Number of children: 2  . Years of education: Olivia Mora   Occupational History  . Not on file.   Social History Main Topics  . Smoking status: Never Smoker  . Smokeless tobacco: Never Used  . Alcohol use No  . Drug use: No  . Sexual activity: Yes    Birth control/ protection: Post-menopausal   Other Topics Concern  . Not on file   Social History Narrative  . 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  He also has a cardiac condition  2 children, 5 grandchildren Never smoked EtOH, none Previously employed for a [employer]  FAMILY HISTORY: Family History  Problem Relation Age of Onset  . Heart attack Brother 69  . CAD Unknown        Several family memebers  . Heart disease Unknown        Several family memebers    ALLERGIES:  has No Known Allergies.  MEDICATIONS:  Current Outpatient Prescriptions  Medication Sig Dispense Refill  . ALPRAZolam (XANAX) 1 MG tablet Take 1 mg by mouth at bedtime as needed for sleep. Reported on 11/26/2015    . [place] 5-2.5-18.5 injection Reported on 11/26/2015    . cetirizine (ZYRTEC) 10 MG tablet Take 10 mg  by mouth daily.    . cholecalciferol (VITAMIN D) 1000 units tablet Take 1,000 Units by mouth daily.    . clonazePAM (KLONOPIN) 1 MG tablet Take 1 mg by mouth 4 (four) times daily.    . cyanocobalamin (,VITAMIN B-12,) 1000 MCG/ML injection INJECT EVERY 4 WEEKS AS DIRECTED  6  . Cyanocobalamin (VITAMIN B-12 IJ) Inject 1 mL as directed every 30 (thirty) days.     Marland Kitchen glipiZIDE (GLUCOTROL) 10 MG tablet Take 10 mg by mouth 2 (two) times daily before a meal.    . HYDROcodone-acetaminophen (NORCO/VICODIN) 5-325 MG tablet Take 1-2 tablets by mouth every 6 (six) hours as needed. 30 tablet 0  . hydrOXYzine (ATARAX/VISTARIL) 10 MG tablet Take 10 mg by mouth daily. Reported on 11/26/2015    . insulin glargine (LANTUS) 100 UNIT/ML injection Inject 30 Units into the skin daily.     Marland Kitchen LANTUS SOLOSTAR 100 UNIT/ML Solostar Pen INJECT 30 UNITS EVERY MORNING  12  . letrozole (FEMARA) 2.5 MG tablet TAKE 1 TABLET (2.5 MG TOTAL) BY MOUTH DAILY. 30 tablet 0  . meclizine (ANTIVERT) 25 MG tablet TAKE 1 TABLET BY MOUTH THREE TIMES DAILY AS NEEDED FOR DIZZINESS  5  . meclizine (ANTIVERT) 50 MG tablet Take 0.5 tablets (25 mg total) by mouth 3 (three) times daily as needed for dizziness. 30 tablet 0  . metFORMIN  (GLUCOPHAGE) 1000 MG tablet Take 500 mg by mouth daily. Alternates 1-2 a day    . Multiple Minerals-Vitamins (CALCIUM & VIT D3 BONE HEALTH PO) Take 1 tablet by mouth daily.    . ranitidine (ZANTAC) 150 MG tablet Take 150 mg by mouth as needed for heartburn. Reported on 11/26/2015    . sertraline (ZOLOFT) 50 MG tablet Take 50 mg by mouth daily. Reported on 11/26/2015    . simvastatin (ZOCOR) 40 MG tablet Take 40 mg by mouth every evening. Reported on 09/25/2015    . tamoxifen (NOLVADEX) 20 MG tablet Take 1 tablet (20 mg total) by mouth daily. 90 tablet 1  . traZODone (DESYREL) 100 MG tablet Take 100 mg by mouth at bedtime.    Alveda Reasons 20 MG TABS tablet Take 20 mg by mouth daily.  12  . venlafaxine XR (EFFEXOR-XR) 75 MG 24 hr capsule Take 1 capsule (75 mg total) by mouth daily with breakfast. 30 capsule 4   No current facility-administered medications for this visit.      Review of Systems  Constitutional: Negative.        Hot flashes associated with taking tamoxifen. Notes the hot flashes are tolerable.   HENT: Negative.   Eyes: Negative.   Respiratory: Negative.   Cardiovascular: Negative.   Gastrointestinal: Negative.   Genitourinary: Negative.   Musculoskeletal: Negative.   Skin: Negative.   Neurological: Negative.   Endo/Heme/Allergies: Negative.   Psychiatric/Behavioral: Negative.   All other systems reviewed and are negative.    PHYSICAL EXAMINATION: ECOG PERFORMANCE STATUS: 1 - Symptomatic but completely ambulatory  Vitals:   02/17/17 1425  BP: 111/61  Pulse: (!) 55  Resp: 16   Filed Weights   02/17/17 1425  Weight: 165 lb 12.8 oz (75.2 kg)     Physical Exam  Constitutional: She is oriented to person, place, and time.  HENT:  Head: Normocephalic and atraumatic.  Eyes: Pupils are equal, round, and reactive to light. Conjunctivae and EOM are normal.  Neck: Normal range of motion. Neck supple.  Cardiovascular: Normal rate, regular rhythm and normal heart sounds.  Pulmonary/Chest: Effort normal and breath sounds normal.  No masses were felt in bilateral breasts, no nipple discharge/inversion, no axillary lymphadenopathy bilaterally.  Abdominal: Soft. Bowel sounds are normal.  Musculoskeletal: Normal range of motion.  Neurological: She is alert and oriented to person, place, and time. Gait normal.  Skin: Skin is warm and dry.  Nursing note and vitals reviewed.   LABORATORY DATA:  I have reviewed the data as listed  CBC    Component Value Date/Time   WBC 5.2 02/13/2017 1153   RBC 4.13 02/13/2017 1153   HGB 13.0 02/13/2017 1153   HCT 40.3 02/13/2017 1153   PLT 168 02/13/2017 1153   MCV 97.6 02/13/2017 1153   MCH 31.5 02/13/2017 1153   MCHC 32.3 02/13/2017 1153   RDW 14.0 02/13/2017 1153   LYMPHSABS 2.0 02/13/2017 1153   MONOABS 0.4 02/13/2017 1153   EOSABS 0.0 02/13/2017 1153   BASOSABS 0.0 02/13/2017 1153      RADIOGRAPHIC STUDIES: I have personally reviewed the radiological reports and agreed with the findings in the report.  Study Result     CLINICAL DATA: Aphasia. History of breast cancer.  EXAM: MRI HEAD WITHOUT AND WITH CONTRAST  TECHNIQUE: Multiplanar, multiecho pulse sequences of the brain and surrounding structures were obtained without and with intravenous contrast.  CONTRAST: 50m MULTIHANCE GADOBENATE DIMEGLUMINE 529 MG/ML IV SOLN  COMPARISON: 10/15/2014  FINDINGS: There is no evidence of acute infarct, intracranial hemorrhage, mass, midline shift, or extra-axial fluid collection. Ventricles and sulci are within normal limits for age. Scattered, punctate foci of T2 hyperintensity in the cerebral white matter bilaterally are unchanged and nonspecific but compatible with minimal chronic small vessel ischemic disease, less than is often seen in patients of this age. Small, chronic infarcts are again seen in the left cerebellum. No abnormal enhancement is identified.  Orbits are unremarkable.  Paranasal sinuses and mastoid air cells are clear. Major intracranial vascular flow voids are preserved. No suspicious skull lesions are seen.  IMPRESSION: 1. No acute intracranial abnormality or mass. 2. Chronic left cerebellar infarcts.   Electronically Signed  By: ALogan BoresM.D.  On: 09/25/2015 19:03    Study Result     EXAM: DUAL X-RAY ABSORPTIOMETRY (DXA) FOR BONE MINERAL DENSITY  IMPRESSION: Ordering Physician: Dr. SPatrici Ranks  Your patient DAriona Deschenecompleted a BMD test on 09/23/2015 using the [place](software version: 14.10) manufactured by [employer] The following summarizes the results of our evaluation. PATIENT BIOGRAPHICAL: Name: SISALY, FASCHINGPatient ID: [id number] Date: 023-Mar-1947Height: 64.0 in. Gender: Female Exam Date: 09/23/2015 Weight: 160.0 lbs. Indications: Breast Ca, Caucasian, History of Fracture (Adult), Post Menopausal Fractures: Finger Treatments: Arimidex, Calcium, Vitamin D DENSITOMETRY RESULTS: Site Region Measured Date Measured Age WHO Classification Young Adult T-score BMD %Change vs. Previous Significant Change (*) AP Spine L2-L4 09/23/2015 69.6 Normal 0.4 1.249 g/cm2 -3.0% Yes AP Spine L2-L4 03/09/2007 61.0 Normal 0.7 1.287 g/cm2 - -  DualFemur Neck Left 09/23/2015 69.6 Normal -0.8 0.932 g/cm2 -2.8% - DualFemur Neck Left 03/09/2007 61.0 Normal -0.6 0.959 g/cm2 - - ASSESSMENT: BMD as determined from Femur Neck Left is 0.932 g/cm2 with a T-Score of -0.8. This patient is considered normal according to W[facility]adal[facility] Guerra([facility] criteria. Compared with the prior study on 03/09/2007, the BMD of the lumbar spine shows a statistically significant decrease. Compared with the prior study on 03/09/2007, the BMD of the left femoral neck shows no statistically significant change. (L-1 was excluded due  to advanced degenerative changes.) (Patient does not meet  criteria for FRAX assessment.)  World Health Organization [facility]) criteria for post-menopausal, Caucasian Women: Normal: T-score at or above -1 SD Osteopenia: T-score between -1 and -2.5 SD Osteoporosis: T-score at or below -2.5 SD  RECOMMENDATIONS: [place] recommends that FDA-approved medial therapies be considered in postmenopausal women and men age 24 or older with a: 1. Hip or vertebral (clinical or morphometric) fracture. 2. T-Score of < -2.5 at the spine or hip. 3. Ten-year fracture probability by FRAX of 3% or greater for hip fracture or 20% or greater for major osteoporotic fracture.  All treatment decisions require clinical judgment and consideration of individual patient factors, including patient preferences, co-morbidities, previous drug use, risk factors not captured in the FRAX model (e.g. falls, vitamin D deficiency, increased bone turnover, interval significant decline in bone density) and possible under-or over-estimation of fracture risk by FRAX.  All patients should ensure an adequate intake of dietary calcium (1200 mg/d) and vitamin D (800 IU daily) unless contraindicated.  FOLLOW-UP: People with diagnosed cases of osteoporosis or osteopenia should be regularly tested for bone mineral density. For patients eligible for Medicare, routine testing is allowed once every 2 years. Testing frequency can be increased for patients who have rapidly progressing disease, or for those who are receiving medical therapy to restore bone mass.  I have reviewed this report, and agree with the above findings.  [facility]   Electronically Signed  By: David Martinique M.D.  On: 09/23/2015 12:32   PATHOLOGY:         ASSESSMENT AND PLAN:  Breast cancer of lower-outer quadrant of right female breast ([place]) ER+ PR+ HER 2 neu -, pT1apN0, small primary at 0.5 cm L breast biopsy negative ONCOTYPE unable to be  performed Adjuvant XRT  Endocrine therapy with Tamoxifen DEXA 09/23/2015 normal bone density    Clinically NED on breast exam today.  Continue tamoxifen, tolerating well. Advised her to notify me asap if she starts having any unexplained calf pain/swelling, pleuritic chest pain, shortness of breath, given her history of thromboembolic events. Advised her to continue annual eye exams. Advised her to notify me if she has any abnormal vaginal bleeding.  Reordered bilateral diagnostic mammogram today.  I have advised to stop taking paroxetine since it will decrease the effectiveness of tamoxifen. I have prescribed her effexor 68m PO daily to take in the place of paroxetine. Patient verbalized understanding.  I have advised her that if she wants to get her teeth pulled for dentures, she needs to stop her xarelto 4-5 days prior to her dental procedure and resume it once all her gum bleeding has resolved.  RTC in 6 months for follow up. Sooner should she have any problems or notes any changes on her breasts.   All questions were answered. The patient knows to call the clinic with any problems, questions or concerns.   This note was electronically signed.  LTwana First MD

## 2017-02-21 ENCOUNTER — Encounter (HOSPITAL_COMMUNITY): Payer: Medicare Other

## 2017-02-23 ENCOUNTER — Ambulatory Visit (INDEPENDENT_AMBULATORY_CARE_PROVIDER_SITE_OTHER): Payer: Medicare Other | Admitting: Neurology

## 2017-02-23 ENCOUNTER — Encounter: Payer: Self-pay | Admitting: Neurology

## 2017-02-23 VITALS — BP 117/75 | HR 72 | Ht 63.5 in | Wt 163.0 lb

## 2017-02-23 DIAGNOSIS — R269 Unspecified abnormalities of gait and mobility: Secondary | ICD-10-CM

## 2017-02-23 DIAGNOSIS — H905 Unspecified sensorineural hearing loss: Secondary | ICD-10-CM | POA: Diagnosis not present

## 2017-02-23 NOTE — Progress Notes (Signed)
PATIENT: Olivia Mora DOB: [birth year]  Chief Complaint  Patient presents with  . NP  FAGAN  . Dizziness    Had stroke one yr ago.  Having dizziness.  [place] hear from L ear.       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   She describes sudden onset of loss of hearing her left ear, vertigo, while reading in her recliner in March 2017, she also described difficulty walking, nausea initially, was taken by ambulance to the emergency room,  I personally reviewed MRI of the brain with and without contrast on September 25 2015, no acute abnormality, chronic cerebellum infarction  Laboratory evaluation in 2018, CMP showed elevated glucose 313, normal CBC, vitamin D 34, per patient most recent A1c 8.6  The intense vertigo, nausea, lasting for less than 1 hour, but she has persistent left-sided hearing loss, gait abnormality things, she described unsteady gait, have to hold on things, walked like a drunk.   REVIEW OF SYSTEMS: Full 14 system review of systems performed and notable only for confusion, weakness, dizziness, depression, anxiety, remitting cells, restless leg, easy bruising easy bleeding, needing for note, hearing loss ringing ears spinning sensation  ALLERGIES: No Known Allergies  HOME MEDICATIONS: Current Outpatient Prescriptions  Medication Sig Dispense Refill  . clonazePAM (KLONOPIN) 1 MG tablet Take 1 mg by mouth 2 (two) times daily.     . metFORMIN (GLUCOPHAGE) 1000 MG tablet Take 1,000 mg by  mouth 2 (two) times daily with a meal. Alternates 1-2 a day    . PARoxetine (PAXIL) 20 MG tablet Take 20 mg by mouth daily.    . simvastatin (ZOCOR) 40 MG tablet Take 40 mg by mouth every evening. Reported on 09/25/2015    . tamoxifen (NOLVADEX) 20 MG tablet Take 1 tablet (20 mg total) by mouth daily. 90 tablet 1  . traZODone (DESYREL) 100 MG tablet Take 100-200 mg by mouth at bedtime.     Alveda Reasons 20 MG TABS tablet Take 20 mg by mouth daily.  12  . ALPRAZolam (XANAX) 1 MG tablet Take 1 mg by mouth at bedtime as needed for sleep. Reported on 11/26/2015    . [place] 5-2.5-18.5 injection Reported on 11/26/2015    . cetirizine (ZYRTEC) 10 MG tablet Take 10 mg by mouth daily.    . cholecalciferol (VITAMIN D) 1000 units tablet Take 1,000 Units by mouth daily.    . cyanocobalamin (,VITAMIN B-12,) 1000 MCG/ML injection INJECT EVERY 4 WEEKS AS DIRECTED  6  . Cyanocobalamin (VITAMIN B-12 IJ) Inject 1 mL as directed every 30 (thirty) days.     Marland Kitchen glipiZIDE (GLUCOTROL) 10 MG tablet Take 10 mg by mouth 2 (two) times daily before a meal.    . HYDROcodone-acetaminophen (NORCO/VICODIN) 5-325 MG tablet Take 1-2 tablets by mouth every 6 (six) hours as needed. (Patient not taking: Reported on 02/23/2017) 30 tablet 0  . hydrOXYzine (ATARAX/VISTARIL) 10 MG tablet Take 10 mg by mouth daily. Reported on 11/26/2015    . insulin glargine (LANTUS) 100 UNIT/ML injection Inject 30  Units into the skin daily.     Marland Kitchen LANTUS SOLOSTAR 100 UNIT/ML Solostar Pen INJECT 30 UNITS EVERY MORNING  12  . letrozole (FEMARA) 2.5 MG tablet TAKE 1 TABLET (2.5 MG TOTAL) BY MOUTH DAILY. (Patient not taking: Reported on 02/23/2017) 30 tablet 0  . meclizine (ANTIVERT) 25 MG tablet TAKE 1 TABLET BY MOUTH THREE TIMES DAILY AS NEEDED FOR DIZZINESS  5  . meclizine (ANTIVERT) 50 MG tablet Take 0.5 tablets (25 mg total) by mouth 3 (three) times daily as needed for dizziness. (Patient not taking: Reported on 02/23/2017) 30 tablet 0  . Multiple Minerals-Vitamins  (CALCIUM & VIT D3 BONE HEALTH PO) Take 1 tablet by mouth daily.    . ranitidine (ZANTAC) 150 MG tablet Take 150 mg by mouth as needed for heartburn. Reported on 11/26/2015    . sertraline (ZOLOFT) 50 MG tablet Take 50 mg by mouth daily. Reported on 11/26/2015    . venlafaxine XR (EFFEXOR-XR) 75 MG 24 hr capsule Take 1 capsule (75 mg total) by mouth daily with breakfast. (Patient not taking: Reported on 02/23/2017) 30 capsule 4   No current facility-administered medications for this visit.     PAST MEDICAL HISTORY: Past Medical History:  Diagnosis Date  . Abnormal EKG   . Anxiety   . Depression   . Diabetes ([place])   . DVT (deep vein thrombosis) in pregnancy ([place])   . GERD (gastroesophageal reflux disease)   . Hyperlipidemia   . Mitral regurgitation    mild/moderate, echo, 10/2011  . Noncompliance 03/17/2016  . Vertigo     PAST SURGICAL HISTORY: Past Surgical History:  Procedure Laterality Date  . APPENDECTOMY    . BREAST LUMPECTOMY WITH RADIOACTIVE SEED AND SENTINEL LYMPH NODE BIOPSY Right 05/13/2015   Procedure:  RIGHT BREAST LUMPECTOMY WITH RADIOACTIVE SEED AND RIGHT SENTINEL LYMPH NODE BIOPSY;  Surgeon: [name], MD;  Location: [facility];  Service: General;  Laterality: Right;  . BREAST LUMPECTOMY WITH RADIOACTIVE SEED LOCALIZATION Left 05/13/2015   Procedure: LEFT BREAST LUMPECTOMY WITH RADIOACTIVE SEED LOCALIZATION;  Surgeon: [name], MD;  Location: [facility];  Service: General;  Laterality: Left;  . OTHER SURGICAL HISTORY     jaw  . OTHER SURGICAL HISTORY     cyst removal jaw  . TUBAL LIGATION      FAMILY HISTORY: Family History  Problem Relation Age of Onset  . Heart attack Brother 23  . CAD Unknown        Several family memebers  . Heart disease Unknown        Several family memebers    SOCIAL HISTORY:  Social History   Social History  . Marital status: Married    Spouse name: N/A  . Number of children: 2  . Years of  education: N/A   Occupational History  . Not on file.   Social History Main Topics  . Smoking status: Never Smoker  . Smokeless tobacco: Never Used  . Alcohol use No  . Drug use: No  . Sexual activity: Yes    Birth control/ protection: Post-menopausal   Other Topics Concern  . Not on file   Social History Narrative   Lives at home with husband (whose has had 5 strokes).  She is the main caregiver.  @ children.  She is retired.       PHYSICAL EXAM   Vitals:   02/23/17 1409  BP: 117/75  Pulse: 72  Weight: 163 lb (73.9 kg)  Height: 5' 3.5" (1.613 m)    Not recorded      Body mass index is 28.42 kg/m.  PHYSICAL EXAMNIATION:  Gen: NAD, conversant, well nourised, obese, well groomed                     Cardiovascular: Regular rate rhythm, no peripheral edema, warm, nontender. Eyes: Conjunctivae clear without exudates or hemorrhage Neck: Supple, no carotid bruits. Pulmonary: Clear to auscultation bilaterally   NEUROLOGICAL EXAM:  MENTAL STATUS: Speech:    Speech is normal; fluent and spontaneous with normal comprehension.  Cognition:     Orientation to time, place and person     Normal recent and remote memory     Normal Attention span and concentration     Normal Language, naming, repeating,spontaneous speech     Fund of knowledge   CRANIAL NERVES: CN II: Visual fields are full to confrontation. Fundoscopic exam is normal with sharp discs and no vascular changes. Pupils are round equal and briskly reactive to light. CN III, IV, VI: extraocular movement are normal. No ptosis. CN V: Facial sensation is intact to pinprick in all 3 divisions bilaterally. Corneal responses are intact.  CN VII: Face is symmetric with normal eye closure and smile. CN VIII: Decreased hearing on the left side, air conduction more than bony conduction CN IX, X: Palate elevates symmetrically. Phonation is normal. CN XI: Head turning and shoulder shrug are intact CN XII: Tongue is  midline with normal movements and no atrophy.  MOTOR: There is no pronator drift of out-stretched arms. Muscle bulk and tone are normal. Muscle strength is normal.  REFLEXES: Reflexes are 2+ and symmetric at the biceps, triceps, knees, and ankles. Plantar responses are flexor.  SENSORY: Intact to light touch, pinprick, positional sensation and vibratory sensation are intact in fingers and toes.  COORDINATION: She has a mild trunk ataxia, no significant limb ataxia  GAIT/STANCE: Wide-based, cautious, could not perform tandem walking   DIAGNOSTIC DATA (LABS, IMAGING, TESTING) - I reviewed patient records, labs, notes, testing and imaging myself where available.   ASSESSMENT AND PLAN  EMMYLOU BIEKER is a 71 y.o. female   Sudden onset vertigo, left-sided hearing loss, nausea, gait abnormality,  Persistent gait abnormality, mild trunk ataxia  Differentiation diagnosis include medicine side effect, deconditioning,  Need to rule out brainstem/cerebellar, left inner ear structural lesion, MRI of the brain/internal acoustic canal with without contrast  Physical therapy  History of DVT,  On chronic anticoagulation treatment, Xarelto 20 mg daily    Marcial Pacas, M.D. Ph.D.  [facility] [address] [place], [place] [address] Ph: [phone] Fax: [phone]  CC: [name], MD

## 2017-02-25 LAB — COMPREHENSIVE METABOLIC PANEL
A/G RATIO: 2.2 (ref 1.2–2.2)
ALT: 14 IU/L (ref 0–32)
AST: 17 IU/L (ref 0–40)
Albumin: 3.9 g/dL (ref 3.5–4.8)
Alkaline Phosphatase: 48 IU/L (ref 39–117)
BUN / CREAT RATIO: 13 (ref 12–28)
BUN: 11 mg/dL (ref 8–27)
CO2: 22 mmol/L (ref 20–29)
CREATININE: 0.83 mg/dL (ref 0.57–1.00)
Calcium: 9.4 mg/dL (ref 8.7–10.3)
Chloride: 107 mmol/L — ABNORMAL HIGH (ref 96–106)
GFR, EST AFRICAN AMERICAN: 82 mL/min/{1.73_m2} (ref >59)
GFR, EST NON AFRICAN AMERICAN: 71 mL/min/{1.73_m2} (ref >59)
GLOBULIN, TOTAL: 1.8 g/dL (ref 1.5–4.5)
Glucose: 199 mg/dL — ABNORMAL HIGH (ref 65–99)
Potassium: 4.5 mmol/L (ref 3.5–5.2)
Sodium: 144 mmol/L (ref 134–144)
TOTAL PROTEIN: 5.7 g/dL — AB (ref 6.0–8.5)

## 2017-02-25 LAB — CK: CK TOTAL: 97 U/L (ref 24–173)

## 2017-02-25 LAB — RPR: RPR: NONREACTIVE

## 2017-02-25 LAB — C-REACTIVE PROTEIN: CRP: 2.5 mg/L (ref 0.0–4.9)

## 2017-02-25 LAB — VITAMIN D 25 HYDROXY (VIT D DEFICIENCY, FRACTURES): Vit D, 25-Hydroxy: 39.6 ng/mL (ref 30.0–100.0)

## 2017-02-25 LAB — HEMOGLOBIN A1C
ESTIMATED AVERAGE GLUCOSE: 206 mg/dL
HEMOGLOBIN A1C: 8.8 % — AB (ref 4.8–5.6)

## 2017-02-25 LAB — COPPER, SERUM: COPPER: 133 ug/dL (ref 72–166)

## 2017-02-25 LAB — TSH: TSH: 1.83 u[IU]/mL (ref 0.450–4.500)

## 2017-02-25 LAB — ANA W/REFLEX IF POSITIVE: ANA: NEGATIVE

## 2017-02-25 LAB — FERRITIN: Ferritin: 54 ng/mL (ref 15–150)

## 2017-02-25 LAB — VITAMIN B12: VITAMIN B 12: 388 pg/mL (ref 232–1245)

## 2017-02-25 LAB — SEDIMENTATION RATE: Sed Rate: 2 mm/hr (ref 0–40)

## 2017-02-28 ENCOUNTER — Telehealth: Payer: Self-pay | Admitting: Neurology

## 2017-02-28 NOTE — Telephone Encounter (Signed)
Called pt to discuss lab results. Pt didn't answer. LVM for pt to call back

## 2017-02-28 NOTE — Telephone Encounter (Signed)
-----   Message from Marcial Pacas, MD sent at 02/28/2017  9:08 AM EDT ----- Please call patient: laboratory evaluation showed elevated A1c 8.8, she should contact her primary care for better control of diabetes.

## 2017-03-01 NOTE — Telephone Encounter (Signed)
Went over the lab work with the patient and I explained that she should let her PCP know that her A1C was elevated in case they needed to make any medication adjustments.

## 2017-03-01 NOTE — Telephone Encounter (Signed)
Patient is returning your call.  

## 2017-03-06 ENCOUNTER — Telehealth (HOSPITAL_COMMUNITY): Payer: Self-pay

## 2017-03-06 ENCOUNTER — Ambulatory Visit (HOSPITAL_COMMUNITY): Payer: Medicare Other

## 2017-03-06 NOTE — Telephone Encounter (Signed)
Her husband is sick and she needs to take him to the MD. R/S for 03/22/17

## 2017-03-22 ENCOUNTER — Ambulatory Visit (HOSPITAL_COMMUNITY): Payer: Medicare Other | Attending: Neurology

## 2017-03-26 ENCOUNTER — Ambulatory Visit
Admission: RE | Admit: 2017-03-26 | Discharge: 2017-03-26 | Disposition: A | Discharge status: Home / Self Care | Payer: Medicare Other | Source: Ambulatory Visit | Attending: Neurology | Admitting: Neurology

## 2017-03-26 DIAGNOSIS — H9042 Sensorineural hearing loss, unilateral, left ear, with unrestricted hearing on the contralateral side: Secondary | ICD-10-CM | POA: Diagnosis not present

## 2017-03-26 DIAGNOSIS — H905 Unspecified sensorineural hearing loss: Secondary | ICD-10-CM

## 2017-03-26 DIAGNOSIS — R269 Unspecified abnormalities of gait and mobility: Secondary | ICD-10-CM | POA: Diagnosis not present

## 2017-03-26 MED ORDER — GADOBENATE DIMEGLUMINE 529 MG/ML IV SOLN
15.0000 mL | Freq: Once | INTRAVENOUS | Status: AC | PRN
  Administered 2017-03-26: 15 mL via INTRAVENOUS

## 2017-03-27 ENCOUNTER — Telehealth: Payer: Self-pay | Admitting: Neurology

## 2017-03-27 NOTE — Telephone Encounter (Signed)
Left message for a return call

## 2017-03-27 NOTE — Telephone Encounter (Signed)
Spoke to patient - she is aware of results and verbalized understanding. 

## 2017-03-27 NOTE — Telephone Encounter (Signed)
MRI of the brain showed old left cerebellar stroke, chronic changes, I will review films with her at next follow-up visit    IMPRESSION:  This MRI of the brain with and without contrast shows the following: 1.   Chronic left cerebellar infarctions and mild chronic microvascular ischemic change, unchanged when compared to the 2017 MRI. 2.   The internal auditory canals appear normal. 3.   There are no acute findings and there is a normal enhancement pattern.

## 2017-04-04 ENCOUNTER — Ambulatory Visit (HOSPITAL_COMMUNITY)
Admission: RE | Admit: 2017-04-04 | Discharge: 2017-04-04 | Disposition: A | Discharge status: Home / Self Care | Payer: Medicare Other | Source: Ambulatory Visit | Attending: Oncology | Admitting: Oncology

## 2017-04-04 ENCOUNTER — Encounter (HOSPITAL_COMMUNITY): Payer: Self-pay

## 2017-04-04 DIAGNOSIS — R928 Other abnormal and inconclusive findings on diagnostic imaging of breast: Secondary | ICD-10-CM | POA: Diagnosis not present

## 2017-04-04 DIAGNOSIS — C50511 Malignant neoplasm of lower-outer quadrant of right female breast: Secondary | ICD-10-CM | POA: Insufficient documentation

## 2017-04-04 DIAGNOSIS — Z170 Estrogen receptor positive status [ER+]: Secondary | ICD-10-CM | POA: Insufficient documentation

## 2017-04-04 HISTORY — DX: Personal history of irradiation: Z92.3

## 2017-04-04 HISTORY — DX: Malignant neoplasm of unspecified site of unspecified female breast: C50.919

## 2017-04-12 ENCOUNTER — Telehealth: Payer: Self-pay | Admitting: Neurology

## 2017-04-12 NOTE — Telephone Encounter (Signed)
Patient called office in reference to referral to rehab that was sent to [facility], she would like to have the referral sent to [facility].  Please call

## 2017-04-18 NOTE — Telephone Encounter (Signed)
Sent to [facility] [phone] - fax [phone].

## 2017-04-24 DIAGNOSIS — R269 Unspecified abnormalities of gait and mobility: Secondary | ICD-10-CM | POA: Diagnosis not present

## 2017-04-24 DIAGNOSIS — H905 Unspecified sensorineural hearing loss: Secondary | ICD-10-CM | POA: Diagnosis not present

## 2017-04-24 DIAGNOSIS — R42 Dizziness and giddiness: Secondary | ICD-10-CM | POA: Diagnosis not present

## 2017-04-25 DIAGNOSIS — Z23 Encounter for immunization: Secondary | ICD-10-CM | POA: Diagnosis not present

## 2017-04-26 DIAGNOSIS — R269 Unspecified abnormalities of gait and mobility: Secondary | ICD-10-CM | POA: Diagnosis not present

## 2017-04-26 DIAGNOSIS — H905 Unspecified sensorineural hearing loss: Secondary | ICD-10-CM | POA: Diagnosis not present

## 2017-04-26 DIAGNOSIS — R42 Dizziness and giddiness: Secondary | ICD-10-CM | POA: Diagnosis not present

## 2017-05-01 DIAGNOSIS — R269 Unspecified abnormalities of gait and mobility: Secondary | ICD-10-CM | POA: Diagnosis not present

## 2017-05-01 DIAGNOSIS — R42 Dizziness and giddiness: Secondary | ICD-10-CM | POA: Diagnosis not present

## 2017-05-01 DIAGNOSIS — H905 Unspecified sensorineural hearing loss: Secondary | ICD-10-CM | POA: Diagnosis not present

## 2017-05-03 DIAGNOSIS — H905 Unspecified sensorineural hearing loss: Secondary | ICD-10-CM | POA: Diagnosis not present

## 2017-05-03 DIAGNOSIS — R42 Dizziness and giddiness: Secondary | ICD-10-CM | POA: Diagnosis not present

## 2017-05-03 DIAGNOSIS — R269 Unspecified abnormalities of gait and mobility: Secondary | ICD-10-CM | POA: Diagnosis not present

## 2017-05-04 DIAGNOSIS — Z79899 Other long term (current) drug therapy: Secondary | ICD-10-CM | POA: Diagnosis not present

## 2017-05-04 DIAGNOSIS — E119 Type 2 diabetes mellitus without complications: Secondary | ICD-10-CM | POA: Diagnosis not present

## 2017-05-04 DIAGNOSIS — I82409 Acute embolism and thrombosis of unspecified deep veins of unspecified lower extremity: Secondary | ICD-10-CM | POA: Diagnosis not present

## 2017-05-08 DIAGNOSIS — R269 Unspecified abnormalities of gait and mobility: Secondary | ICD-10-CM | POA: Diagnosis not present

## 2017-05-08 DIAGNOSIS — H905 Unspecified sensorineural hearing loss: Secondary | ICD-10-CM | POA: Diagnosis not present

## 2017-05-08 DIAGNOSIS — R42 Dizziness and giddiness: Secondary | ICD-10-CM | POA: Diagnosis not present

## 2017-05-11 DIAGNOSIS — E785 Hyperlipidemia, unspecified: Secondary | ICD-10-CM | POA: Diagnosis not present

## 2017-05-11 DIAGNOSIS — E119 Type 2 diabetes mellitus without complications: Secondary | ICD-10-CM | POA: Diagnosis not present

## 2017-05-11 DIAGNOSIS — C50919 Malignant neoplasm of unspecified site of unspecified female breast: Secondary | ICD-10-CM | POA: Diagnosis not present

## 2017-05-22 ENCOUNTER — Other Ambulatory Visit (HOSPITAL_COMMUNITY): Payer: Self-pay | Admitting: Oncology

## 2017-05-22 DIAGNOSIS — C50511 Malignant neoplasm of lower-outer quadrant of right female breast: Secondary | ICD-10-CM

## 2017-05-22 DIAGNOSIS — Z170 Estrogen receptor positive status [ER+]: Principal | ICD-10-CM

## 2017-05-22 MED ORDER — TAMOXIFEN CITRATE 20 MG PO TABS: 20.0000 mg | ORAL_TABLET | Freq: Every day | ORAL | 1 refills | Status: DC

## 2017-06-05 ENCOUNTER — Ambulatory Visit: Payer: Medicare Other | Admitting: Neurology

## 2017-06-05 ENCOUNTER — Telehealth: Payer: Self-pay | Admitting: *Deleted

## 2017-06-05 NOTE — Telephone Encounter (Signed)
No showed follow up appointment. 

## 2017-06-06 ENCOUNTER — Encounter: Payer: Self-pay | Admitting: Neurology

## 2017-08-03 DIAGNOSIS — J0100 Acute maxillary sinusitis, unspecified: Secondary | ICD-10-CM | POA: Diagnosis not present

## 2017-08-03 DIAGNOSIS — R5383 Other fatigue: Secondary | ICD-10-CM | POA: Diagnosis not present

## 2017-08-17 ENCOUNTER — Other Ambulatory Visit (HOSPITAL_COMMUNITY): Payer: Self-pay | Admitting: *Deleted

## 2017-08-17 DIAGNOSIS — Z170 Estrogen receptor positive status [ER+]: Principal | ICD-10-CM

## 2017-08-17 DIAGNOSIS — C50511 Malignant neoplasm of lower-outer quadrant of right female breast: Secondary | ICD-10-CM

## 2017-08-18 ENCOUNTER — Ambulatory Visit (HOSPITAL_COMMUNITY): Payer: Medicare Other | Admitting: Oncology

## 2017-08-18 ENCOUNTER — Other Ambulatory Visit (HOSPITAL_COMMUNITY): Payer: Medicare Other

## 2017-08-25 DIAGNOSIS — E119 Type 2 diabetes mellitus without complications: Secondary | ICD-10-CM | POA: Diagnosis not present

## 2017-08-31 DIAGNOSIS — E119 Type 2 diabetes mellitus without complications: Secondary | ICD-10-CM | POA: Diagnosis not present

## 2017-09-11 ENCOUNTER — Other Ambulatory Visit (HOSPITAL_COMMUNITY): Payer: Self-pay | Admitting: *Deleted

## 2017-09-11 DIAGNOSIS — C50511 Malignant neoplasm of lower-outer quadrant of right female breast: Secondary | ICD-10-CM

## 2017-09-11 NOTE — Progress Notes (Signed)
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; ER+/PR+/HER2-  CURRENT THERAPY: Tamoxifen daily, beginning 03/2016 (after taking Arimidex from 08/2015-11/2015, then Femara from 11/2015-12/2015)   BRIEF ONCOLOGIC HISTORY:    Breast cancer of lower-outer quadrant of right female breast ([place])   03/02/2015 Initial Diagnosis    Right breast biopsy 8:00: IDC with extracellular mucin, ER 100%, PR 50%, Ki-67 20%, grade 1 HER-2 negative ratio 1.11; left breast biopsy 4:00: Intraductal papilloma with UDH and fibrocystic changes      03/06/2015 Breast MRI    Right breast 8:00 position: 6 mm enhancing nodule with associated dilated duct and with blood; left breast 9:00 middle depth: 4 mm nodule, 4:00: 1 cm nodule nonspecific appearance      05/13/2015 Procedure    Left partial mastectomy by Dr. 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  [id number]      06/30/2015 - 07/23/2015 Radiation Therapy    42.72 Gy at 2.67 Gy per fraction x 16 fractions by Dr. [name] in [facility].      08/28/2015 - 11/26/2015 Anti-estrogen oral therapy    Arimidex daily.      09/23/2015 Imaging    Bone density- BMD as determined from Femur Neck Left is 0.932 g/cm2 with a T-Score of -0.8. This patient is considered normal according to [facility]) criteria.      10/26/2015 Imaging    MRI brain- negative for any acute intracranial abnormality or mass chronic left cerebellar infarcts.      11/25/2015 Adverse Reaction    Intolerance to Arimidex.      11/26/2015 - 12/21/2015 Anti-estrogen oral therapy    Femara      12/21/2015 Adverse Reaction    Dizziness and myalgias.      04/01/2016 -  Anti-estrogen oral  therapy    Tamoxifen        INTERVAL HISTORY:  Olivia Mora 72 y.o. female returns for routine follow-up for right breast cancer.   Here today unaccompanied.   Overall, she tells me she has been feeling "okay."  Appetite 100%; energy levels 50%.  She shares with me that her husband of 23 years recently passed away under hospice care in mid-January 2019. Her depression and anxiety have been worse as a result.  She tells me that she has been offered hospice grief support resources, which she tries to attend when she can. 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  She felt isolated during that time as a caregiver.  She does not feel like she has a great support system in her family at times.  Remains on Effexor XR 75 mg daily; also uses Klonopin for anxiety.     Remains on Tamoxifen with good tolerance. Endorses occasional hot flashes, which are manageable. Denies any new/worsening athralgias, vaginal dryness, or vaginal bleeding.  She continues to struggle with chronic dizziness; this has been a longstanding issue for her and is largely stable.  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  She tells me that she has trouble affording her Xarelto and Lantus each month.  She is not sure what else can be done, as she was told that she needed to stay on this medication "because of my blood clots."   I will check with our financial advocate, Lendell Caprice, to see if there are any financial resources available for Olivia Mora.    Last mammogram in 03/2017 was normal. Denies any breast complaints today.     REVIEW OF SYSTEMS:  Review of Systems  Constitutional: Positive for fatigue.  HENT:  Negative.   Eyes:  Negative.   Respiratory: Negative.  Negative for cough and shortness of breath.   Cardiovascular: Positive for leg swelling.  Gastrointestinal: Negative.   Endocrine: Positive for hot flashes.  Genitourinary: Negative.  Negative for vaginal bleeding.   Musculoskeletal: Negative.   Skin: Negative.   Neurological: Positive for dizziness. Negative for headaches.  Hematological: Negative.   Psychiatric/Behavioral: Positive for depression. The patient is nervous/anxious.      PAST MEDICAL/SURGICAL HISTORY:  Past Medical History:  Diagnosis Date  . Abnormal EKG   . Anxiety   . Breast cancer ([place]) 2016   Right breast lumpectomy  . Depression   . Diabetes ([place])   . DVT (deep vein thrombosis) in pregnancy ([place])   . GERD (gastroesophageal reflux disease)   . Hyperlipidemia   . Mitral regurgitation    mild/moderate, echo, 10/2011  . Noncompliance 03/17/2016  . Personal history of radiation therapy 2016  . Vertigo    Past Surgical History:  Procedure Laterality Date  . APPENDECTOMY    . BREAST LUMPECTOMY Right 2016  . BREAST LUMPECTOMY WITH RADIOACTIVE SEED AND SENTINEL LYMPH NODE BIOPSY Right 05/13/2015   Procedure:  RIGHT BREAST LUMPECTOMY WITH RADIOACTIVE SEED AND RIGHT SENTINEL LYMPH NODE BIOPSY;  Surgeon: [name], MD;  Location: [facility];  Service: General;  Laterality: Right;  . BREAST LUMPECTOMY WITH RADIOACTIVE SEED LOCALIZATION Left 05/13/2015   Procedure: LEFT BREAST LUMPECTOMY WITH RADIOACTIVE SEED LOCALIZATION;  Surgeon: [name], MD;  Location: [facility];  Service: General;  Laterality: Left;  . OTHER SURGICAL HISTORY     jaw  . OTHER SURGICAL HISTORY     cyst removal jaw  . TUBAL LIGATION       SOCIAL HISTORY:  Social History   Socioeconomic History  . Marital status: Married    Spouse name: Not on file  . Number of children: 2  . Years of education: Not on file  . Highest education level: Not on file  Social Needs  .  Financial resource strain: Not on file  . Food insecurity - worry: Not on file  . Food insecurity - inability: Not on file  . Transportation needs - medical: Not on file  . Transportation needs - non-medical: Not on file  Occupational History  . Not on file  Tobacco Use  . Smoking status: Never Smoker  . Smokeless tobacco: Never Used  Substance and Sexual Activity  . Alcohol use: No    Alcohol/week: 0.0 oz  . Drug use: No  . Sexual activity: Yes    Birth control/protection: Post-menopausal  Other Topics Concern  . Not on file  Social History Narrative   Lives at home with husband (whose has had  5 strokes).  She is the main caregiver.  @ children.  She is retired.      FAMILY HISTORY:  Family History  Problem Relation Age of Onset  . Heart attack Brother 25  . CAD Unknown        Several family memebers  . Heart disease Unknown        Several family memebers    CURRENT MEDICATIONS:  Outpatient Encounter Medications as of 09/12/2017  Medication Sig Note  . ALPRAZolam (XANAX) 1 MG tablet Take 1 mg by mouth at bedtime as needed for sleep. Reported on 11/26/2015   . cetirizine (ZYRTEC) 10 MG tablet Take 10 mg by mouth daily.   . cholecalciferol (VITAMIN D) 1000 units tablet Take 1,000 Units by mouth daily.   . clonazePAM (KLONOPIN) 1 MG tablet Take 1 mg by mouth 2 (two) times daily.  06/05/2015: 1-2 daily  . Cyanocobalamin (VITAMIN B-12 IJ) Inject 1 mL as directed every 30 (thirty) days.    Marland Kitchen glipiZIDE (GLUCOTROL XL) 5 MG 24 hr tablet TK 1 T PO QD   . insulin glargine (LANTUS) 100 UNIT/ML injection Inject 40 Units into the skin daily.    Marland Kitchen LANTUS SOLOSTAR 100 UNIT/ML Solostar Pen INJECT 40 UNITS EVERY MORNING 11/26/2015: Received from: External Pharmacy  . letrozole (FEMARA) 2.5 MG tablet TAKE 1 TABLET (2.5 MG TOTAL) BY MOUTH DAILY.   . meclizine (ANTIVERT) 25 MG tablet TAKE 1 TABLET BY MOUTH THREE TIMES DAILY AS NEEDED FOR DIZZINESS 03/17/2016: Received from: External Pharmacy  .  metFORMIN (GLUCOPHAGE) 1000 MG tablet Take 1,000 mg by mouth 2 (two) times daily with a meal. Alternates 1-2 a day   . Multiple Minerals-Vitamins (CALCIUM & VIT D3 BONE HEALTH PO) Take 1 tablet by mouth daily.   . simvastatin (ZOCOR) 40 MG tablet Take 40 mg by mouth every evening. Reported on 09/25/2015   . tamoxifen (NOLVADEX) 20 MG tablet Take 1 tablet (20 mg total) by mouth daily.   . traZODone (DESYREL) 100 MG tablet Take 100-200 mg by mouth at bedtime.    Alveda Reasons 20 MG TABS tablet Take 20 mg by mouth daily. 11/26/2015: Received from: External Pharmacy  . [DISCONTINUED] PARoxetine (PAXIL) 20 MG tablet Take 20 mg by mouth daily.   . [DISCONTINUED] tamoxifen (NOLVADEX) 20 MG tablet Take 1 tablet (20 mg total) daily by mouth.   . [DISCONTINUED] venlafaxine XR (EFFEXOR-XR) 75 MG 24 hr capsule    . venlafaxine XR (EFFEXOR XR) 150 MG 24 hr capsule Take 1 capsule by mouth daily.   . [DISCONTINUED] glipiZIDE (GLUCOTROL) 10 MG tablet Take 5 mg by mouth daily before breakfast.     No facility-administered encounter medications on file as of 09/12/2017.     ALLERGIES:  No Known Allergies   PHYSICAL EXAM:  ECOG Performance status: 1 - Symptomatic; remains largely independent   Vitals:   09/12/17 1350  BP: (!) 151/81  Pulse: 85  Resp: 18  Temp: 97.8 F (36.6 C)  SpO2: 97%   Filed Weights   09/12/17 1350  Weight: 162 lb 1.6 oz (73.5 kg)    Physical Exam  Constitutional: She is oriented to person, place, and time and well-developed, well-nourished, and in no distress.  HENT:  Head: Normocephalic.  Mouth/Throat: Oropharynx is clear and moist. No oropharyngeal exudate.  Eyes: Conjunctivae are normal. Pupils are equal, round, and reactive to light. No scleral icterus.  Neck: Normal range of motion. Neck supple.  Cardiovascular: Normal rate and regular rhythm.  Pulmonary/Chest: Effort normal and breath sounds normal. No respiratory distress. She has no wheezes.    Abdominal: Soft.  Bowel sounds are normal. There is no tenderness.  Musculoskeletal: Normal range of motion. She exhibits edema (Trace ankle edema bilat; right slightly greater than left, but subtle ).  Lymphadenopathy:    She has no cervical adenopathy.       Right: No supraclavicular adenopathy present.       Left: No supraclavicular adenopathy present.  Neurological: She is alert and oriented to person, place, and time. No cranial nerve deficit. Gait normal.  Subtle resting tremor   Skin: Skin is warm and dry. No rash noted.  Psychiatric: Memory and judgment normal.  Flat affect; depressed mood   Nursing note and vitals reviewed.    LABORATORY DATA:  I have reviewed the labs as listed.  CBC    Component Value Date/Time   WBC 6.8 09/12/2017 1254   RBC 4.18 09/12/2017 1254   HGB 12.7 09/12/2017 1254   HCT 39.4 09/12/2017 1254   PLT 241 09/12/2017 1254   MCV 94.3 09/12/2017 1254   MCH 30.4 09/12/2017 1254   MCHC 32.2 09/12/2017 1254   RDW 13.3 09/12/2017 1254   LYMPHSABS 1.9 09/12/2017 1254   MONOABS 0.6 09/12/2017 1254   EOSABS 0.0 09/12/2017 1254   BASOSABS 0.0 09/12/2017 1254   CMP Latest Ref Rng & Units 09/12/2017 02/23/2017 02/13/2017  Glucose 65 - 99 mg/dL 358(H) 199(H) 313(H)  BUN 6 - 20 mg/dL '18 11 14  ' Creatinine 0.44 - 1.00 mg/dL 0.97 0.83 1.00  Sodium 135 - 145 mmol/L 137 144 139  Potassium 3.5 - 5.1 mmol/L 3.9 4.5 4.2  Chloride 101 - 111 mmol/L 101 107(H) 105  CO2 22 - 32 mmol/L '23 22 22  ' Calcium 8.9 - 10.3 mg/dL 9.0 9.4 9.0  Total Protein 6.5 - 8.1 g/dL 6.5 5.7(L) 5.9(L)  Total Bilirubin 0.3 - 1.2 mg/dL 0.4 <0.2 0.6  Alkaline Phos 38 - 126 U/L 52 48 44  AST 15 - 41 U/L '23 17 26  ' ALT 14 - 54 U/L '17 14 21    ' PENDING LABS:    DIAGNOSTIC IMAGING:  *The following radiologic images and reports have been reviewed independently and agree with below findings.  Last diagnostic mammogram 04/04/17 CLINICAL DATA:  Annual examination. Patient had right breast lumpectomy in 2016 for  right breast cancer. She had a left breast excisional biopsy with benign pathology results.  EXAM: 2D DIGITAL DIAGNOSTIC BILATERAL MAMMOGRAM WITH CAD AND ADJUNCT TOMO  COMPARISON:  Previous exam(s).  ACR Breast Density Category b: There are scattered areas of fibroglandular density.  FINDINGS: There are lumpectomy changes in the deep outer right breast. There is slight architectural distortion in the outer left breast at the site of prior excisional biopsy. No mass, nonsurgical distortion, or suspicious microcalcification is identified in either breast to suggest malignancy.  Mammographic images were processed with CAD.  IMPRESSION: No evidence of malignancy in either breast.  RECOMMENDATION: Diagnostic mammogram is suggested in 1 year. (Code:[id number]-01Y)  I have discussed the findings and recommendations with the patient. Results were also provided in writing at the conclusion of the visit. If applicable, a reminder letter will be sent to the patient regarding the next appointment.  BI-RADS CATEGORY  2: Benign.   Electronically Signed   By: Curlene Dolphin M.D.   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 Treated with (R) lumpectomy by Dr. [name] on 05/13/15. Went on to complete adjuvant breast radiation therapy, which she completed on 07/23/15. Started anti-estrogen therapy Arimidex in 08/2015; Arimidex was switched to Femara in 11/2015 d/t intolerance.  Then Femara was switched to Tamoxifen in 03/2016 d/t dizziness and myalgias with Femara.   -Remains on Tamoxifen daily. Plan to continue for total of 5-10 years (through at least 08/2020).  -Clinical breast exam performed today and benign.  -Last diagnostic mammogram 03/2017 negative. Will be due for annual exam in 03/2018; orders placed today. She  wishes to have her mammograms done in [place] now, since she has recently moved.  Therefore, she was provided paper copy of mammogram order to take to imaging location of her choice in [place].  Fax number to our clinic written on order so we may receive the imaging results.  -Return to cancer center in ~6 months for follow-up a few days after her mammogram with labs.   Bone health:  -Last DEXA scan on 09/23/15 showed normal bone density with T-score -0.8.   -Reiterated that Tamoxifen can actually increase bone density over time when compared to aromatase inhibitors. Encouraged continued calcium/vitamin D supplement and recommended increased weight-bearing exercises as tolerated.    Depression/Anxiety:  -Suspect her depression and anxiety are multifactorial.  She shared with me that she is grieving the very recent death of her husband of 68 years. He died under hospice care in mid-January.  She has access to the hospice grief/bereavement resources there, but not very often.   -She does have h/o depression, which dates back several years. Previously on paroxetine, which was stopped at her last visit because of decreased efficacy of Tamoxifen with Paxil.  She was switched to Effexor XR 75 mg daily.  She has had some symptomatic improvement, but remains very clinically depressed.  Discussed option of increasing Effexor XR to 150 mg daily; increased dose may help improve her mood, and may also provide additional benefit by reducing her hot flashes secondary to Tamoxifen.  She agreed with this plan.  New prescription for new dose of Effexor XR sent to her pharmacy today.  She is using Klonopin for her anxiety, which has been chronic and appears to be relatively well-managed.  -Also discussed referral to psychiatry. I would recommend she see Dr. Daron Offer in [facility] for consultation. Shared with her the benefit of having psychiatry evaluate her current situation, her medications, and make the best recommendations  for her mental health.  She is enthusiastic about this plan.  I also think she would benefit from counseling, if Dr. Daron Offer agrees.  She appears very isolated, as she was the sole caregiver for her ailing husband for many years and has not been able to maintain long-term meaningful relationships d/t being a caregiver.  Shared with her that having a network of support is very helpful for those with depression, anxiety, and grief.  Spent some time today providing emotional support for her with active listening, validation of her concerns, problem-solving, and expressive supportive counseling.   Chronic anticoagulation:  -H/o VTEs, currently on Xarelto; managed by PCP.  However, patient reports financial difficulties with Xarelto. I will ask our financial advocate, Lendell Caprice, to see if the patient is eligible for any support for her Xarelto. If we locate this support, then we may be able to assume responsibility of prescribing her Xarelto in the future. However,  will defer to Dr. Willey Blade (PCP) for now until we see if we can find additional financial resources for her.        Dispo:  -Mammogram due in 03/2018; orders placed today.  -Referral to Dr. Daron Offer with psychiatry for depression/anxiety.  -Return to cancer center in ~6 months for follow-up with labs a few days after mammogram.    All questions were answered to patient's stated satisfaction. Encouraged patient to call with any new concerns or questions before her next visit to the cancer center and we can certain see her sooner, if needed.    A total of 35 minutes was spent in face-to-face care of this patient, with greater than 50% of that time spent in counseling and care-coordination.      Orders placed this encounter:  Orders Placed This Encounter  Procedures  . MM DIAG BREAST TOMO BILATERAL      Mike Craze, NP [place] [phone]

## 2017-09-12 ENCOUNTER — Inpatient Hospital Stay (HOSPITAL_COMMUNITY): Payer: Medicare Other | Admitting: Adult Health

## 2017-09-12 ENCOUNTER — Inpatient Hospital Stay (HOSPITAL_COMMUNITY): Payer: Medicare Other | Attending: Internal Medicine

## 2017-09-12 ENCOUNTER — Other Ambulatory Visit: Payer: Self-pay

## 2017-09-12 ENCOUNTER — Encounter (HOSPITAL_COMMUNITY): Payer: Self-pay | Admitting: Adult Health

## 2017-09-12 VITALS — BP 151/81 | HR 85 | Temp 97.8°F | Resp 18 | Ht 63.5 in | Wt 162.1 lb

## 2017-09-12 DIAGNOSIS — Z170 Estrogen receptor positive status [ER+]: Secondary | ICD-10-CM | POA: Insufficient documentation

## 2017-09-12 DIAGNOSIS — F418 Other specified anxiety disorders: Secondary | ICD-10-CM

## 2017-09-12 DIAGNOSIS — Z7901 Long term (current) use of anticoagulants: Secondary | ICD-10-CM

## 2017-09-12 DIAGNOSIS — C50511 Malignant neoplasm of lower-outer quadrant of right female breast: Secondary | ICD-10-CM

## 2017-09-12 DIAGNOSIS — Z86718 Personal history of other venous thrombosis and embolism: Secondary | ICD-10-CM | POA: Diagnosis not present

## 2017-09-12 DIAGNOSIS — R42 Dizziness and giddiness: Secondary | ICD-10-CM | POA: Insufficient documentation

## 2017-09-12 DIAGNOSIS — N951 Menopausal and female climacteric states: Secondary | ICD-10-CM

## 2017-09-12 DIAGNOSIS — F329 Major depressive disorder, single episode, unspecified: Secondary | ICD-10-CM

## 2017-09-12 DIAGNOSIS — R232 Flushing: Secondary | ICD-10-CM

## 2017-09-12 DIAGNOSIS — F32A Depression, unspecified: Secondary | ICD-10-CM

## 2017-09-12 DIAGNOSIS — T451X5A Adverse effect of antineoplastic and immunosuppressive drugs, initial encounter: Secondary | ICD-10-CM

## 2017-09-12 LAB — CBC WITH DIFFERENTIAL/PLATELET
BASOS PCT: 0 %
Basophils Absolute: 0 10*3/uL (ref 0.0–0.1)
Eosinophils Absolute: 0 10*3/uL (ref 0.0–0.7)
Eosinophils Relative: 1 %
HEMATOCRIT: 39.4 % (ref 36.0–46.0)
HEMOGLOBIN: 12.7 g/dL (ref 12.0–15.0)
LYMPHS ABS: 1.9 10*3/uL (ref 0.7–4.0)
Lymphocytes Relative: 28 %
MCH: 30.4 pg (ref 26.0–34.0)
MCHC: 32.2 g/dL (ref 30.0–36.0)
MCV: 94.3 fL (ref 78.0–100.0)
MONOS PCT: 8 %
Monocytes Absolute: 0.6 10*3/uL (ref 0.1–1.0)
NEUTROS ABS: 4.3 10*3/uL (ref 1.7–7.7)
NEUTROS PCT: 63 %
Platelets: 241 10*3/uL (ref 150–400)
RBC: 4.18 MIL/uL (ref 3.87–5.11)
RDW: 13.3 % (ref 11.5–15.5)
WBC: 6.8 10*3/uL (ref 4.0–10.5)

## 2017-09-12 LAB — COMPREHENSIVE METABOLIC PANEL
ALBUMIN: 3.8 g/dL (ref 3.5–5.0)
ALT: 17 U/L (ref 14–54)
ANION GAP: 13 (ref 5–15)
AST: 23 U/L (ref 15–41)
Alkaline Phosphatase: 52 U/L (ref 38–126)
BUN: 18 mg/dL (ref 6–20)
CO2: 23 mmol/L (ref 22–32)
Calcium: 9 mg/dL (ref 8.9–10.3)
Chloride: 101 mmol/L (ref 101–111)
Creatinine, Ser: 0.97 mg/dL (ref 0.44–1.00)
GFR calc Af Amer: >60 mL/min (ref >60)
GFR calc non Af Amer: 57 mL/min — ABNORMAL LOW (ref >60)
GLUCOSE: 358 mg/dL — AB (ref 65–99)
Potassium: 3.9 mmol/L (ref 3.5–5.1)
Sodium: 137 mmol/L (ref 135–145)
TOTAL PROTEIN: 6.5 g/dL (ref 6.5–8.1)
Total Bilirubin: 0.4 mg/dL (ref 0.3–1.2)

## 2017-09-12 MED ORDER — TAMOXIFEN CITRATE 20 MG PO TABS: 20.0000 mg | ORAL_TABLET | Freq: Every day | ORAL | 1 refills | Status: DC

## 2017-09-12 MED ORDER — VENLAFAXINE HCL ER 150 MG PO CP24: ORAL_CAPSULE | ORAL | 2 refills | Status: DC

## 2017-09-12 NOTE — Patient Instructions (Signed)
[place] at [facility] Discharge Instructions  RECOMMENDATIONS MADE BY THE CONSULTANT AND ANY TEST RESULTS WILL BE SENT TO YOUR REFERRING PHYSICIAN.  You were seen today by Mike Craze NP. Referring you to Dr. Daron Offer in [place]. Your mammogram is due in September of this year. Return in September for labs and follow up.    Thank you for choosing [place] at [facility] to provide your oncology and hematology care.  To afford each patient quality time with our provider, please arrive at least 15 minutes before your scheduled appointment time.    If you have a lab appointment with the [facility] please come in thru the  Main Entrance and check in at the main information desk  You need to re-schedule your appointment should you arrive 10 or more minutes late.  We strive to give you quality time with our providers, and arriving late affects you and other patients whose appointments are after yours.  Also, if you no show three or more times for appointments you may be dismissed from the clinic at the providers discretion.     Again, thank you for choosing [facility].  Our hope is that these requests will decrease the amount of time that you wait before being seen by our physicians.       _____________________________________________________________  Should you have questions after your visit to [facility], please contact our office at [phone] between the hours of 8:30 a.m. and 4:30 p.m.  Voicemails left after 4:30 p.m. will not be returned until the following business day.  For prescription refill requests, have your pharmacy contact our office.       Resources For Cancer Patients and their Caregivers ? American Cancer Society: Can assist with transportation, wigs, general needs, runs Look Good Feel Better.        602-142-4402 ? Cancer Care: Provides financial assistance, online support groups,  medication/co-pay assistance.  1-800-813-HOPE 959 721 6810) ? [place] Assists [place] cancer patients and their families through emotional , educational and financial support.  (331)647-0908 ? [place] DSS Where to apply for food stamps, Medicaid and utility assistance. 308-768-2384 ? [facility]: Transportation to medical appointments. (432)019-4418 ? Social Security Administration: May apply for disability if have a Stage IV cancer. (323)250-2356 (571)757-7026 ? [employer], Disability and Transit Services: Assists with nutrition, care and transit needs. [place] Support Programs: @10RELATIVEDAYS @ > Cancer Support Group  2nd Tuesday of the month 1pm-2pm, Journey Room  > Creative Journey  3rd Tuesday of the month 1130am-1pm, Journey Room  > Look Good Feel Better  1st Wednesday of the month 10am-12 noon, Journey Room (Call [place] to register [phone])

## 2017-11-08 DIAGNOSIS — D1723 Benign lipomatous neoplasm of skin and subcutaneous tissue of right leg: Secondary | ICD-10-CM | POA: Diagnosis not present

## 2017-11-08 DIAGNOSIS — I10 Essential (primary) hypertension: Secondary | ICD-10-CM | POA: Diagnosis not present

## 2017-11-08 DIAGNOSIS — M79651 Pain in right thigh: Secondary | ICD-10-CM | POA: Diagnosis not present

## 2017-11-14 ENCOUNTER — Encounter (HOSPITAL_COMMUNITY): Payer: Self-pay | Admitting: Psychiatry

## 2017-11-14 ENCOUNTER — Ambulatory Visit (INDEPENDENT_AMBULATORY_CARE_PROVIDER_SITE_OTHER): Payer: Medicare Other | Admitting: Psychiatry

## 2017-11-14 VITALS — BP 147/92 | HR 87 | Ht 63.5 in | Wt 161.0 lb

## 2017-11-14 DIAGNOSIS — Z91410 Personal history of adult physical and sexual abuse: Secondary | ICD-10-CM

## 2017-11-14 DIAGNOSIS — Z91411 Personal history of adult psychological abuse: Secondary | ICD-10-CM

## 2017-11-14 DIAGNOSIS — T451X5A Adverse effect of antineoplastic and immunosuppressive drugs, initial encounter: Secondary | ICD-10-CM | POA: Diagnosis not present

## 2017-11-14 DIAGNOSIS — F32A Depression, unspecified: Secondary | ICD-10-CM

## 2017-11-14 DIAGNOSIS — R232 Flushing: Secondary | ICD-10-CM

## 2017-11-14 DIAGNOSIS — F329 Major depressive disorder, single episode, unspecified: Secondary | ICD-10-CM

## 2017-11-14 DIAGNOSIS — F341 Dysthymic disorder: Secondary | ICD-10-CM

## 2017-11-14 MED ORDER — VENLAFAXINE HCL ER 75 MG PO CP24
ORAL_CAPSULE | ORAL | 3 refills | Status: AC
Start: 2017-11-14 — End: ?
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
## 2017-11-22 ENCOUNTER — Encounter (HOSPITAL_COMMUNITY): Payer: Self-pay | Admitting: Adult Health

## 2017-11-22 NOTE — Progress Notes (Signed)
Received and reviewed faxed copy of diagnostic imaging report for Olivia Mora for bilateral diagnostic mammogram performed at [facility] on 11/16/17.   IMPRESSION:  No mammographic evidence of malignancy s/p right lumpectomy and left excisional biopsy.   RECOMMENDATION:  Diagnostic mammogram suggested in 1 year.    Per documentation on report, patient is aware of these results as they were shared by the radiologist at the time of the study.     A copy of this report has been sent to HIM to be scanned into patient record.     Mike Craze, NP [place] [phone]

## 2017-11-24 ENCOUNTER — Other Ambulatory Visit (HOSPITAL_COMMUNITY): Payer: Self-pay | Admitting: Psychiatry

## 2017-11-24 DIAGNOSIS — R232 Flushing: Secondary | ICD-10-CM

## 2017-11-24 DIAGNOSIS — F329 Major depressive disorder, single episode, unspecified: Secondary | ICD-10-CM

## 2017-11-24 DIAGNOSIS — F32A Depression, unspecified: Secondary | ICD-10-CM

## 2017-11-24 DIAGNOSIS — T451X5A Adverse effect of antineoplastic and immunosuppressive drugs, initial encounter: Secondary | ICD-10-CM

## 2017-11-27 DIAGNOSIS — E119 Type 2 diabetes mellitus without complications: Secondary | ICD-10-CM | POA: Diagnosis not present

## 2017-11-30 DIAGNOSIS — E119 Type 2 diabetes mellitus without complications: Secondary | ICD-10-CM | POA: Diagnosis not present

## 2017-11-30 DIAGNOSIS — I82409 Acute embolism and thrombosis of unspecified deep veins of unspecified lower extremity: Secondary | ICD-10-CM | POA: Diagnosis not present

## 2017-12-01 DIAGNOSIS — M25552 Pain in left hip: Secondary | ICD-10-CM | POA: Diagnosis not present

## 2017-12-26 DIAGNOSIS — M7062 Trochanteric bursitis, left hip: Secondary | ICD-10-CM | POA: Diagnosis not present

## 2018-01-31 ENCOUNTER — Ambulatory Visit (HOSPITAL_COMMUNITY): Payer: Medicare Other | Admitting: Psychiatry

## 2018-02-14 ENCOUNTER — Telehealth (HOSPITAL_COMMUNITY): Payer: Self-pay

## 2018-02-14 NOTE — Telephone Encounter (Signed)
Spoke with Dr. Walden Field regarding pt's recent phone call complaining of "burning from the inside out". Pt is taking Tamoxifen and is on Effexor 75 mg daily for hot flashes as well as depression. Her last MD visit was with Mike Craze NP in Feb and NP increased her Effexor dose to 150 mg daily but sent it to the wrong pharmacy. Pt requested that another prescription be sent into her pharmacy in [place] where she is residing at this time in the hopes that it will lessen her hot flashes which seem to be worse. Dr. Walden Field to look over pt's chart and get back with [place]

## 2018-02-16 ENCOUNTER — Telehealth (HOSPITAL_COMMUNITY): Payer: Self-pay | Admitting: *Deleted

## 2018-02-16 NOTE — Telephone Encounter (Signed)
LMOM for pt to get refills of Effexor from original prescribing doctor.Pt to call back if there are any questions.

## 2018-03-12 DIAGNOSIS — Z79899 Other long term (current) drug therapy: Secondary | ICD-10-CM | POA: Diagnosis not present

## 2018-03-12 DIAGNOSIS — I10 Essential (primary) hypertension: Secondary | ICD-10-CM | POA: Diagnosis not present

## 2018-03-12 DIAGNOSIS — E119 Type 2 diabetes mellitus without complications: Secondary | ICD-10-CM | POA: Diagnosis not present

## 2018-03-12 DIAGNOSIS — E785 Hyperlipidemia, unspecified: Secondary | ICD-10-CM | POA: Diagnosis not present

## 2018-03-15 ENCOUNTER — Other Ambulatory Visit (HOSPITAL_COMMUNITY): Payer: Medicare Other

## 2018-03-15 ENCOUNTER — Ambulatory Visit (HOSPITAL_COMMUNITY): Payer: Medicare Other | Admitting: Internal Medicine

## 2018-03-15 DIAGNOSIS — I10 Essential (primary) hypertension: Secondary | ICD-10-CM | POA: Diagnosis not present

## 2018-03-15 DIAGNOSIS — E119 Type 2 diabetes mellitus without complications: Secondary | ICD-10-CM | POA: Diagnosis not present

## 2018-03-22 ENCOUNTER — Other Ambulatory Visit (HOSPITAL_COMMUNITY): Payer: Self-pay | Admitting: Adult Health

## 2018-03-22 ENCOUNTER — Other Ambulatory Visit (HOSPITAL_COMMUNITY): Payer: Self-pay | Admitting: Hematology

## 2018-03-22 DIAGNOSIS — Z853 Personal history of malignant neoplasm of breast: Secondary | ICD-10-CM

## 2018-03-27 ENCOUNTER — Other Ambulatory Visit (HOSPITAL_COMMUNITY): Payer: Self-pay | Admitting: *Deleted

## 2018-03-27 DIAGNOSIS — C50511 Malignant neoplasm of lower-outer quadrant of right female breast: Secondary | ICD-10-CM

## 2018-03-27 DIAGNOSIS — Z170 Estrogen receptor positive status [ER+]: Principal | ICD-10-CM

## 2018-03-27 MED ORDER — TAMOXIFEN CITRATE 20 MG PO TABS: 20.0000 mg | ORAL_TABLET | Freq: Every day | ORAL | 3 refills | Status: DC

## 2018-04-10 ENCOUNTER — Other Ambulatory Visit (HOSPITAL_COMMUNITY): Payer: Medicare Other

## 2018-04-10 ENCOUNTER — Ambulatory Visit (HOSPITAL_COMMUNITY): Payer: Medicare Other

## 2018-04-10 ENCOUNTER — Encounter (HOSPITAL_COMMUNITY): Payer: Self-pay

## 2018-04-16 ENCOUNTER — Ambulatory Visit (HOSPITAL_COMMUNITY): Payer: Medicare Other | Admitting: Internal Medicine

## 2018-04-24 ENCOUNTER — Ambulatory Visit (HOSPITAL_COMMUNITY): Payer: Medicare Other | Admitting: Internal Medicine

## 2018-05-21 ENCOUNTER — Other Ambulatory Visit (HOSPITAL_COMMUNITY): Payer: Self-pay | Admitting: *Deleted

## 2018-05-21 DIAGNOSIS — C50511 Malignant neoplasm of lower-outer quadrant of right female breast: Secondary | ICD-10-CM

## 2018-05-21 DIAGNOSIS — Z170 Estrogen receptor positive status [ER+]: Principal | ICD-10-CM

## 2018-05-22 ENCOUNTER — Ambulatory Visit (HOSPITAL_COMMUNITY): Payer: Medicare Other | Admitting: Internal Medicine

## 2018-05-22 ENCOUNTER — Ambulatory Visit (HOSPITAL_COMMUNITY): Payer: Medicare Other

## 2018-05-22 ENCOUNTER — Encounter (HOSPITAL_COMMUNITY): Payer: Self-pay

## 2018-05-22 ENCOUNTER — Other Ambulatory Visit (HOSPITAL_COMMUNITY)
Admission: RE | Admit: 2018-05-22 | Discharge: 2018-05-22 | Disposition: A | Discharge status: Home / Self Care | Payer: Medicare Other | Source: Ambulatory Visit | Attending: Internal Medicine | Admitting: Internal Medicine

## 2018-05-22 ENCOUNTER — Inpatient Hospital Stay (HOSPITAL_COMMUNITY): Payer: Medicare Other | Attending: Internal Medicine

## 2018-05-22 DIAGNOSIS — C50511 Malignant neoplasm of lower-outer quadrant of right female breast: Secondary | ICD-10-CM | POA: Diagnosis not present

## 2018-05-22 DIAGNOSIS — E119 Type 2 diabetes mellitus without complications: Secondary | ICD-10-CM | POA: Insufficient documentation

## 2018-05-22 LAB — HEMOGLOBIN A1C
HEMOGLOBIN A1C: 8.7 % — AB (ref 4.8–5.6)
MEAN PLASMA GLUCOSE: 202.99 mg/dL

## 2018-05-22 LAB — CBC WITH DIFFERENTIAL/PLATELET
ABS IMMATURE GRANULOCYTES: 0.01 10*3/uL (ref 0.00–0.07)
Basophils Absolute: 0 10*3/uL (ref 0.0–0.1)
Basophils Relative: 0 %
EOS PCT: 2 %
Eosinophils Absolute: 0.1 10*3/uL (ref 0.0–0.5)
HEMATOCRIT: 37.9 % (ref 36.0–46.0)
HEMOGLOBIN: 12.4 g/dL (ref 12.0–15.0)
Immature Granulocytes: 0 %
LYMPHS ABS: 1.7 10*3/uL (ref 0.7–4.0)
LYMPHS PCT: 34 %
MCH: 30.9 pg (ref 26.0–34.0)
MCHC: 32.7 g/dL (ref 30.0–36.0)
MCV: 94.5 fL (ref 80.0–100.0)
MONO ABS: 0.5 10*3/uL (ref 0.1–1.0)
Monocytes Relative: 10 %
NEUTROS ABS: 2.6 10*3/uL (ref 1.7–7.7)
Neutrophils Relative %: 54 %
Platelets: 250 10*3/uL (ref 150–400)
RBC: 4.01 MIL/uL (ref 3.87–5.11)
RDW: 12.9 % (ref 11.5–15.5)
WBC: 4.8 10*3/uL (ref 4.0–10.5)
nRBC: 0 % (ref 0.0–0.2)

## 2018-05-22 LAB — COMPREHENSIVE METABOLIC PANEL
ALT: 17 U/L (ref 0–44)
AST: 20 U/L (ref 15–41)
Albumin: 3.9 g/dL (ref 3.5–5.0)
Alkaline Phosphatase: 50 U/L (ref 38–126)
Anion gap: 9 (ref 5–15)
BUN: 16 mg/dL (ref 8–23)
CHLORIDE: 110 mmol/L (ref 98–111)
CO2: 24 mmol/L (ref 22–32)
CREATININE: 0.79 mg/dL (ref 0.44–1.00)
Calcium: 9.3 mg/dL (ref 8.9–10.3)
GFR calc Af Amer: >60 mL/min (ref >60)
GLUCOSE: 150 mg/dL — AB (ref 70–99)
POTASSIUM: 4.2 mmol/L (ref 3.5–5.1)
Sodium: 143 mmol/L (ref 135–145)
Total Bilirubin: 0.5 mg/dL (ref 0.3–1.2)
Total Protein: 6.6 g/dL (ref 6.5–8.1)

## 2018-05-23 ENCOUNTER — Encounter (HOSPITAL_COMMUNITY): Payer: Self-pay | Admitting: Hematology

## 2018-05-23 NOTE — Progress Notes (Signed)
Patient requested to transfer to [facility], closer to where she lives.  Faxed patient records to [phone].  Confirmation transmission log received.

## 2018-05-29 DIAGNOSIS — Z23 Encounter for immunization: Secondary | ICD-10-CM | POA: Diagnosis not present

## 2018-06-04 ENCOUNTER — Ambulatory Visit (HOSPITAL_COMMUNITY): Payer: Medicare Other | Admitting: Internal Medicine

## 2018-06-07 DIAGNOSIS — Z853 Personal history of malignant neoplasm of breast: Secondary | ICD-10-CM | POA: Diagnosis not present

## 2018-06-07 DIAGNOSIS — F329 Major depressive disorder, single episode, unspecified: Secondary | ICD-10-CM

## 2018-06-07 DIAGNOSIS — Z86711 Personal history of pulmonary embolism: Secondary | ICD-10-CM

## 2018-06-07 DIAGNOSIS — M25551 Pain in right hip: Secondary | ICD-10-CM | POA: Diagnosis not present

## 2018-06-07 DIAGNOSIS — N951 Menopausal and female climacteric states: Secondary | ICD-10-CM | POA: Diagnosis not present

## 2018-06-07 DIAGNOSIS — Z7901 Long term (current) use of anticoagulants: Secondary | ICD-10-CM

## 2018-06-07 DIAGNOSIS — Z86718 Personal history of other venous thrombosis and embolism: Secondary | ICD-10-CM

## 2018-06-07 DIAGNOSIS — Z923 Personal history of irradiation: Secondary | ICD-10-CM | POA: Diagnosis not present

## 2018-06-07 DIAGNOSIS — M25552 Pain in left hip: Secondary | ICD-10-CM | POA: Diagnosis not present

## 2018-06-07 DIAGNOSIS — Z79899 Other long term (current) drug therapy: Secondary | ICD-10-CM | POA: Diagnosis not present

## 2018-06-19 DIAGNOSIS — E1159 Type 2 diabetes mellitus with other circulatory complications: Secondary | ICD-10-CM | POA: Diagnosis not present

## 2018-06-19 DIAGNOSIS — I82409 Acute embolism and thrombosis of unspecified deep veins of unspecified lower extremity: Secondary | ICD-10-CM | POA: Diagnosis not present

## 2018-06-22 DIAGNOSIS — M160 Bilateral primary osteoarthritis of hip: Secondary | ICD-10-CM | POA: Diagnosis not present

## 2018-07-02 DIAGNOSIS — J019 Acute sinusitis, unspecified: Secondary | ICD-10-CM | POA: Diagnosis not present

## 2018-09-21 DIAGNOSIS — Z7689 Persons encountering health services in other specified circumstances: Secondary | ICD-10-CM | POA: Diagnosis not present

## 2018-10-01 DIAGNOSIS — E1159 Type 2 diabetes mellitus with other circulatory complications: Secondary | ICD-10-CM | POA: Diagnosis not present

## 2018-10-01 DIAGNOSIS — E785 Hyperlipidemia, unspecified: Secondary | ICD-10-CM | POA: Diagnosis not present

## 2018-11-21 DIAGNOSIS — E119 Type 2 diabetes mellitus without complications: Secondary | ICD-10-CM | POA: Diagnosis not present

## 2018-12-28 DIAGNOSIS — E1159 Type 2 diabetes mellitus with other circulatory complications: Secondary | ICD-10-CM | POA: Diagnosis not present

## 2018-12-28 DIAGNOSIS — Z79899 Other long term (current) drug therapy: Secondary | ICD-10-CM | POA: Diagnosis not present

## 2019-01-01 DIAGNOSIS — I10 Essential (primary) hypertension: Secondary | ICD-10-CM | POA: Diagnosis not present

## 2019-01-01 DIAGNOSIS — I82401 Acute embolism and thrombosis of unspecified deep veins of right lower extremity: Secondary | ICD-10-CM | POA: Diagnosis not present

## 2019-01-01 DIAGNOSIS — E1159 Type 2 diabetes mellitus with other circulatory complications: Secondary | ICD-10-CM | POA: Diagnosis not present

## 2019-01-08 DIAGNOSIS — N3000 Acute cystitis without hematuria: Secondary | ICD-10-CM | POA: Diagnosis not present

## 2019-01-08 DIAGNOSIS — M5442 Lumbago with sciatica, left side: Secondary | ICD-10-CM | POA: Diagnosis not present

## 2019-01-08 DIAGNOSIS — M5441 Lumbago with sciatica, right side: Secondary | ICD-10-CM | POA: Diagnosis not present

## 2019-01-08 DIAGNOSIS — M5431 Sciatica, right side: Secondary | ICD-10-CM | POA: Diagnosis not present

## 2019-01-08 DIAGNOSIS — B9689 Other specified bacterial agents as the cause of diseases classified elsewhere: Secondary | ICD-10-CM | POA: Diagnosis not present

## 2019-01-08 DIAGNOSIS — K760 Fatty (change of) liver, not elsewhere classified: Secondary | ICD-10-CM | POA: Diagnosis not present

## 2019-01-17 DIAGNOSIS — R922 Inconclusive mammogram: Secondary | ICD-10-CM | POA: Diagnosis not present

## 2019-01-17 DIAGNOSIS — C50511 Malignant neoplasm of lower-outer quadrant of right female breast: Secondary | ICD-10-CM | POA: Diagnosis not present

## 2019-01-24 DIAGNOSIS — Z86718 Personal history of other venous thrombosis and embolism: Secondary | ICD-10-CM | POA: Diagnosis not present

## 2019-01-24 DIAGNOSIS — Z853 Personal history of malignant neoplasm of breast: Secondary | ICD-10-CM | POA: Diagnosis not present

## 2019-01-24 DIAGNOSIS — Z86711 Personal history of pulmonary embolism: Secondary | ICD-10-CM | POA: Diagnosis not present

## 2019-01-24 DIAGNOSIS — M25552 Pain in left hip: Secondary | ICD-10-CM | POA: Diagnosis not present

## 2019-01-24 DIAGNOSIS — M25551 Pain in right hip: Secondary | ICD-10-CM | POA: Diagnosis not present

## 2019-01-24 DIAGNOSIS — N951 Menopausal and female climacteric states: Secondary | ICD-10-CM | POA: Diagnosis not present

## 2019-01-24 DIAGNOSIS — C50511 Malignant neoplasm of lower-outer quadrant of right female breast: Secondary | ICD-10-CM | POA: Diagnosis not present

## 2019-02-07 DIAGNOSIS — M7062 Trochanteric bursitis, left hip: Secondary | ICD-10-CM | POA: Diagnosis not present

## 2019-04-09 DIAGNOSIS — E1159 Type 2 diabetes mellitus with other circulatory complications: Secondary | ICD-10-CM | POA: Diagnosis not present

## 2019-04-11 DIAGNOSIS — I82401 Acute embolism and thrombosis of unspecified deep veins of right lower extremity: Secondary | ICD-10-CM | POA: Diagnosis not present

## 2019-04-11 DIAGNOSIS — I10 Essential (primary) hypertension: Secondary | ICD-10-CM | POA: Diagnosis not present

## 2019-04-11 DIAGNOSIS — E1159 Type 2 diabetes mellitus with other circulatory complications: Secondary | ICD-10-CM | POA: Diagnosis not present

## 2019-07-16 DIAGNOSIS — I10 Essential (primary) hypertension: Secondary | ICD-10-CM | POA: Diagnosis not present

## 2019-07-16 DIAGNOSIS — E1159 Type 2 diabetes mellitus with other circulatory complications: Secondary | ICD-10-CM | POA: Diagnosis not present

## 2019-07-17 DIAGNOSIS — E1159 Type 2 diabetes mellitus with other circulatory complications: Secondary | ICD-10-CM | POA: Diagnosis not present

## 2019-07-17 DIAGNOSIS — Z79899 Other long term (current) drug therapy: Secondary | ICD-10-CM | POA: Diagnosis not present

## 2019-07-17 DIAGNOSIS — I10 Essential (primary) hypertension: Secondary | ICD-10-CM | POA: Diagnosis not present

## 2019-08-19 ENCOUNTER — Other Ambulatory Visit: Payer: Self-pay

## 2019-08-19 NOTE — Patient Outreach (Signed)
[facility]) Care Management  08/19/2019  JACK RISTAU 12-02-1945 [id number]   Medication Adherence call to Mrs. [place] Compliant Voice message left with a call back number. Mr. Conliffe is showing past due under [place].   [facility] Management Direct Dial [phone]  Fax [phone] [email or web address]

## 2019-11-04 DIAGNOSIS — Z79899 Other long term (current) drug therapy: Secondary | ICD-10-CM | POA: Diagnosis not present

## 2019-11-04 DIAGNOSIS — E1159 Type 2 diabetes mellitus with other circulatory complications: Secondary | ICD-10-CM | POA: Diagnosis not present

## 2019-11-04 DIAGNOSIS — I10 Essential (primary) hypertension: Secondary | ICD-10-CM | POA: Diagnosis not present

## 2019-11-12 DIAGNOSIS — E785 Hyperlipidemia, unspecified: Secondary | ICD-10-CM | POA: Diagnosis not present

## 2019-11-12 DIAGNOSIS — E1159 Type 2 diabetes mellitus with other circulatory complications: Secondary | ICD-10-CM | POA: Diagnosis not present

## 2020-02-05 DIAGNOSIS — E1159 Type 2 diabetes mellitus with other circulatory complications: Secondary | ICD-10-CM | POA: Diagnosis not present

## 2020-02-10 DIAGNOSIS — E1159 Type 2 diabetes mellitus with other circulatory complications: Secondary | ICD-10-CM | POA: Diagnosis not present

## 2020-02-10 DIAGNOSIS — I10 Essential (primary) hypertension: Secondary | ICD-10-CM | POA: Diagnosis not present

## 2020-02-10 DIAGNOSIS — K219 Gastro-esophageal reflux disease without esophagitis: Secondary | ICD-10-CM | POA: Diagnosis not present

## 2020-02-25 DIAGNOSIS — M7060 Trochanteric bursitis, unspecified hip: Secondary | ICD-10-CM | POA: Diagnosis not present

## 2020-03-11 DIAGNOSIS — R928 Other abnormal and inconclusive findings on diagnostic imaging of breast: Secondary | ICD-10-CM | POA: Diagnosis not present

## 2020-03-11 DIAGNOSIS — Z853 Personal history of malignant neoplasm of breast: Secondary | ICD-10-CM | POA: Diagnosis not present

## 2020-05-12 DIAGNOSIS — E1159 Type 2 diabetes mellitus with other circulatory complications: Secondary | ICD-10-CM | POA: Diagnosis not present

## 2020-05-21 DIAGNOSIS — D6859 Other primary thrombophilia: Secondary | ICD-10-CM | POA: Diagnosis not present

## 2020-05-21 DIAGNOSIS — Z23 Encounter for immunization: Secondary | ICD-10-CM | POA: Diagnosis not present

## 2020-05-21 DIAGNOSIS — K219 Gastro-esophageal reflux disease without esophagitis: Secondary | ICD-10-CM | POA: Diagnosis not present

## 2020-05-21 DIAGNOSIS — I10 Essential (primary) hypertension: Secondary | ICD-10-CM | POA: Diagnosis not present

## 2020-05-21 DIAGNOSIS — E1100 Type 2 diabetes mellitus with hyperosmolarity without nonketotic hyperglycemic-hyperosmolar coma (NKHHC): Secondary | ICD-10-CM | POA: Diagnosis not present

## 2020-05-21 DIAGNOSIS — R7309 Other abnormal glucose: Secondary | ICD-10-CM | POA: Diagnosis not present

## 2020-05-25 ENCOUNTER — Inpatient Hospital Stay: Payer: Medicare Other | Admitting: Oncology

## 2020-05-25 DIAGNOSIS — Z20822 Contact with and (suspected) exposure to covid-19: Secondary | ICD-10-CM | POA: Diagnosis not present

## 2020-05-25 DIAGNOSIS — R0981 Nasal congestion: Secondary | ICD-10-CM | POA: Diagnosis not present

## 2020-06-01 DIAGNOSIS — R2681 Unsteadiness on feet: Secondary | ICD-10-CM | POA: Diagnosis not present

## 2020-06-01 DIAGNOSIS — E7800 Pure hypercholesterolemia, unspecified: Secondary | ICD-10-CM | POA: Diagnosis not present

## 2020-06-01 DIAGNOSIS — I160 Hypertensive urgency: Secondary | ICD-10-CM | POA: Diagnosis not present

## 2020-06-01 DIAGNOSIS — R270 Ataxia, unspecified: Secondary | ICD-10-CM | POA: Diagnosis not present

## 2020-06-01 DIAGNOSIS — Z79899 Other long term (current) drug therapy: Secondary | ICD-10-CM | POA: Diagnosis not present

## 2020-06-01 DIAGNOSIS — I10 Essential (primary) hypertension: Secondary | ICD-10-CM | POA: Diagnosis not present

## 2020-07-28 DIAGNOSIS — E119 Type 2 diabetes mellitus without complications: Secondary | ICD-10-CM | POA: Diagnosis not present

## 2020-08-05 ENCOUNTER — Telehealth: Payer: Self-pay | Admitting: Oncology

## 2020-08-05 NOTE — Telephone Encounter (Signed)
08/05/20 Appt scheduled and given to patient

## 2020-08-14 DIAGNOSIS — E1159 Type 2 diabetes mellitus with other circulatory complications: Secondary | ICD-10-CM | POA: Diagnosis not present

## 2020-08-21 DIAGNOSIS — G5600 Carpal tunnel syndrome, unspecified upper limb: Secondary | ICD-10-CM | POA: Diagnosis not present

## 2020-08-21 DIAGNOSIS — E1100 Type 2 diabetes mellitus with hyperosmolarity without nonketotic hyperglycemic-hyperosmolar coma (NKHHC): Secondary | ICD-10-CM | POA: Diagnosis not present

## 2020-08-21 DIAGNOSIS — I10 Essential (primary) hypertension: Secondary | ICD-10-CM | POA: Diagnosis not present

## 2020-08-21 DIAGNOSIS — R7309 Other abnormal glucose: Secondary | ICD-10-CM | POA: Diagnosis not present

## 2020-08-24 NOTE — Progress Notes (Incomplete)
[facility]  [address],  [place]  [address] [phone]  Clinic Day:  08/24/2020  Referring physician: [name], MD  This document serves as a record of services personally performed by Hosie Poisson, MD. It was created on their behalf by Curry,Lauren E, a trained medical scribe. The creation of this record is based on the scribe's personal observations and the provider's statements to them.  CHIEF COMPLAINT:  CC: History of stage I right breast cancer  Current Treatment:  Surveillance   HISTORY OF PRESENT ILLNESS:  Olivia Mora is a 75 y.o. female who I am asked to see by [name] for transfer of her medical oncology care.  Stage I right breast cancer was diagnosed in 2016 after she presented with bleeding from the nipple.  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  Estrogen receptor was positive at 100% and progesterone receptor positive at 50% with a Ki 67 of 20% and HER2 negative.  She had a left partial mastectomy by Dr. [name] on May 13, 2015 with findings of a 5 mm invasive breast cancer of the right breast with 2 negative nodes for a T1a N0 M0, stage IA.  She was given radiation, which was completed in June of 2017.  She was tried on multiple types of hormonal therapy including anastrozole from February 2017 to May, then letrozole from June to August and then tamoxifen, which she later stopped approximately 9 months ago.  She did not tolerate the hormonal therapy.  She has never had any evidence of recurrence.  She had a mammogram on Nov 16, 2017 which was negative.  She had menarche at age 84 and menopause in her late 3's.  She was on hormone replacement therapy for 20 years.  She is here today for a follow up and states that she was in the emergency room back  in June.  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  She has been having some dizziness recently, which could be caused by one of her medications.  She has requested a change in her nerve pill, and I discussed with her that she would need to follow up with her family doctor for this.  She has had some constipation, and I have recommended Mirilax to treat this.  Her appetite is good, and she has gained 2 pounds since her last visit.  She denies fever, or chills.  She denies sore throat, cough, and dyspnea.  She denies nausea, vomiting, and bowel issues.  She also denies chest pain, abdominal pain, or pain elsewhere.  She is on B12 injections monthly in addition to her usual medications.  INTERVAL HISTORY:  Olivia Mora is here for routine follow up ***.  Annual bilateral mammogram from August 2021 was clear.   Her  appetite is good, and she has gained/lost _ pounds since her last visit.  She denies fever, chills or other signs of infection.  She denies nausea, vomiting, bowel issues, or abdominal pain.  She denies sore throat, cough, dyspnea, or chest pain.  REVIEW OF SYSTEMS:  Review of Systems - Oncology   VITALS:  There were no vitals taken for this visit.  Wt Readings from Last 3 Encounters:  09/12/17 162 lb 1.6 oz (73.5 kg)  02/23/17 163 lb (73.9 kg)  02/17/17 165 lb 12.8 oz (75.2 kg)    There is no height or weight on file to calculate BMI.  Performance status (ECOG): {CHL ONC [id number]  PHYSICAL EXAM:  Physical Exam  LABS:   CBC Latest Ref Rng & Units 05/22/2018 09/12/2017 02/13/2017  WBC 4.0 - 10.5 K/uL 4.8 6.8 5.2  Hemoglobin 12.0 - 15.0 g/dL 12.4 12.7 13.0  Hematocrit 36.0 - 46.0 % 37.9 39.4 40.3  Platelets 150 - 400 K/uL 250 241 168   CMP Latest Ref Rng & Units 05/22/2018 09/12/2017 02/23/2017  Glucose 70 - 99 mg/dL 150(H) 358(H) 199(H)  BUN 8 - 23 mg/dL '16 18 11  ' Creatinine 0.44 - 1.00 mg/dL 0.79 0.97 0.83  Sodium 135  - 145 mmol/L 143 137 144  Potassium 3.5 - 5.1 mmol/L 4.2 3.9 4.5  Chloride 98 - 111 mmol/L 110 101 107(H)  CO2 22 - 32 mmol/L '24 23 22  ' Calcium 8.9 - 10.3 mg/dL 9.3 9.0 9.4  Total Protein 6.5 - 8.1 g/dL 6.6 6.5 5.7(L)  Total Bilirubin 0.3 - 1.2 mg/dL 0.5 0.4 <0.2  Alkaline Phos 38 - 126 U/L 50 52 48  AST 15 - 41 U/L '20 23 17  ' ALT 0 - 44 U/L '17 17 14     ' No results found for: CEA1 / No results found for: CEA1 No results found for: PSA1 No results found for: PPJ093 No results found for: OIZ124  No results found for: TOTALPROTELP, ALBUMINELP, A1GS, A2GS, BETS, BETA2SER, GAMS, MSPIKE, SPEI Lab Results  Component Value Date   FERRITIN 54 02/23/2017   No results found for: LDH   STUDIES:   She underwent digital diagnostic bilateral mammogram with tomography on 03/11/2020 showing: breast density category B.  1. No mammographic evidence of malignancy involving either breast. 2. Expected post lumpectomy changes involving the RIGHT breast.   Allergies: No Known Allergies  Current Medications: Current Outpatient Medications  Medication Sig Dispense Refill  . ALPRAZolam (XANAX) 1 MG tablet Take 1 mg by mouth at bedtime as needed for sleep. Reported on 11/26/2015    . cetirizine (ZYRTEC) 10 MG tablet Take 10 mg by mouth daily.    . cholecalciferol (VITAMIN D) 1000 units tablet Take 1,000 Units by mouth daily.    . clonazePAM (KLONOPIN) 1 MG tablet Take 1 mg by mouth 2 (two) times daily.     . Cyanocobalamin (VITAMIN B-12 IJ) Inject 1 mL as directed every 30 (thirty) days.     Marland Kitchen glipiZIDE (GLUCOTROL XL) 5 MG 24 hr tablet TK 1 T PO QD  4  . insulin glargine (LANTUS) 100 UNIT/ML injection Inject 40 Units into the skin daily.     Marland Kitchen LANTUS SOLOSTAR 100 UNIT/ML Solostar Pen INJECT 40 UNITS EVERY MORNING  12  . meclizine (ANTIVERT) 25 MG tablet TAKE 1 TABLET BY MOUTH THREE TIMES DAILY AS NEEDED FOR DIZZINESS  5  . metFORMIN (GLUCOPHAGE) 1000 MG tablet Take 1,000 mg by mouth 2 (two) times daily  with a meal. Alternates 1-2 a day    . Multiple Minerals-Vitamins (CALCIUM & VIT D3 BONE HEALTH PO) Take 1 tablet by mouth daily.    . simvastatin (ZOCOR) 40 MG tablet Take 40 mg by mouth every evening. Reported on 09/25/2015    . tamoxifen (NOLVADEX) 20 MG tablet Take 1 tablet (20 mg total) by mouth daily. 90 tablet 3  . traZODone (DESYREL) 100 MG tablet Take 100-200 mg by mouth at bedtime.     Marland Kitchen  venlafaxine XR (EFFEXOR-XR) 75 MG 24 hr capsule 3  qam 90 capsule 3  . XARELTO 20 MG TABS tablet Take 20 mg by mouth daily.  12   No current facility-administered medications for this visit.     ASSESSMENT & PLAN:   Assessment:   1. Stage IA invasive right breast cancer diagnosed in 2016 and treated with surgery, radiation and partial hormonal therapy.  She has no evidence of disease.  2. Severe hot flashes despite Effexor at 150 mg daily.  We have added clonidine 0.1 mg daily.  3. 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  She is currently on Xarelto and is negative for any hereditary hypercoagulability.  4. Chronic depression which is worsened by the fact that she is now widowed this year and grieving.  5. Hip pain which appears to be osteoarthritis.  I will refer her to the orthopedic surgeon for evaluation and treatment.  Plan: We will see her back in 1 year with bilateral mammogram.  She understands and agrees with this plan of care.     I provided *** minutes (10:41 AM - 10:41 AM) of face-to-face time during this this encounter and > 50% was spent counseling as documented under my assessment and plan.    Derwood Kaplan, MD [facility] [address] [place] [address] Dept: [phone] Dept Fax: [phone]   I, Rita Ohara, am acting as scribe for Derwood Kaplan, MD  I have reviewed this report as typed by the medical scribe, and it is complete and accurate.

## 2020-08-26 ENCOUNTER — Inpatient Hospital Stay: Payer: Medicare Other | Attending: Oncology | Admitting: Oncology

## 2020-10-14 NOTE — Progress Notes (Signed)
Olivia Mora  [address] [place],  [place]  [address] [phone]  Clinic Day:  10/15/2020  Referring physician: [name], MD  This document serves as a record of services personally performed by Hosie Poisson, MD. It was created on their behalf by Curry,Lauren E, a trained medical scribe. The creation of this record is based on the scribe's personal observations and the provider's statements to them.  CHIEF COMPLAINT:  CC: History of stage I right breast cancer  Current Treatment:  Surveillance   HISTORY OF PRESENT ILLNESS:  Olivia Mora is a 75 y.o. female with a history of stage I right breast cancer was diagnosed in 2016 after she presented with bleeding from the nipple.  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  Estrogen receptor was positive at 100% and progesterone receptor positive at 50% with a Ki 67 of 20% and HER2 negative.  She had a partial mastectomy by Dr. [name] on May 13, 2015 with findings of a 5 mm invasive breast cancer of the right breast with 2 negative nodes for a T1a N0 M0, stage IA.  She was given radiation, which was completed in June of 2017.  She was tried on multiple types of hormonal therapy including anastrozole from February 2017 to May, then letrozole from June to August and then tamoxifen, which she later stopped approximately 9 months ago.  She did not tolerate the hormonal therapy.  She has never had any evidence of recurrence.  She had a mammogram on Nov 16, 2017 which was negative.  She had menarche at age 38 and menopause in her late 56's.  She was on hormone replacement therapy for 20 years.  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    INTERVAL HISTORY:  Olivia Mora is  here for follow up and states that she has been fairly well other than struggling with intermittent dizziness.  This will occur when she gets up and occasionally when she is walking.  She has brought this up to her other physicians, but nothing has been determined.  I will try her on Antivert, but will also obtain CT head for further evaluation.  She has had some headaches which come and go.  She also notes hot flashes on occasion and continues Effexor.  Bilateral mammogram from August 2021 was clear.  Her  appetite is good, and she has gained 2 and 1/2 pounds since her last visit.  She denies fever, chills or other signs of infection.  She denies nausea, vomiting, bowel issues, or abdominal pain.  She denies sore throat, cough, dyspnea, or chest pain.    REVIEW OF SYSTEMS:  Review of Systems  Constitutional: Negative.  Negative for appetite change, chills, fatigue, fever and unexpected weight change.  HENT:  Negative.   Eyes: Negative.   Respiratory: Negative.  Negative for chest tightness, cough, hemoptysis, shortness of breath and wheezing.   Cardiovascular: Negative.  Negative for chest pain, leg swelling and palpitations.  Gastrointestinal: Negative.  Negative for abdominal distention, abdominal pain, blood in stool, constipation, diarrhea, nausea and vomiting.  Endocrine: Positive for hot flashes.  Genitourinary: Negative.  Negative for difficulty urinating, dysuria, frequency and hematuria.   Musculoskeletal: Negative.  Negative for arthralgias, back pain, flank  pain, gait problem and myalgias.  Skin: Negative.   Neurological: Positive for dizziness (intermittent) and headaches (intermittent). Negative for extremity weakness, gait problem, light-headedness, numbness, seizures and speech difficulty.  Hematological: Negative.   Psychiatric/Behavioral: Negative.  Negative for depression and sleep disturbance. The patient is not nervous/anxious.      VITALS:  Temperature 98.4 F (36.9 C),  temperature source Oral, resp. rate 18, height '5\' 4"'  (1.626 m), weight 167 lb 1.6 oz (75.8 kg), SpO2 96 %.  Wt Readings from Last 3 Encounters:  10/15/20 167 lb 1.6 oz (75.8 kg)  09/12/17 162 lb 1.6 oz (73.5 kg)  02/23/17 163 lb (73.9 kg)    Body mass index is 28.68 kg/m.  Performance status (ECOG): 1 - Symptomatic but completely ambulatory  PHYSICAL EXAM:  Physical Exam Constitutional:      General: She is not in acute distress.    Appearance: Normal appearance. She is normal weight.  HENT:     Head: Normocephalic and atraumatic.  Eyes:     General: No scleral icterus.    Extraocular Movements: Extraocular movements intact.     Conjunctiva/sclera: Conjunctivae normal.     Pupils: Pupils are equal, round, and reactive to light.  Cardiovascular:     Rate and Rhythm: Normal rate and regular rhythm.     Pulses: Normal pulses.     Heart sounds: Normal heart sounds. No murmur heard. No friction rub. No gallop.   Pulmonary:     Effort: Pulmonary effort is normal. No respiratory distress.     Breath sounds: Normal breath sounds.  Chest:     Comments: She has a scar in the lower outer quadrant of the left breast.  She has a scar in the lower outer quadrant of the right breast.  She has a tiny nodule at 8 o'clock which has been longstanding since her surgery.  No masses in either breast. Abdominal:     General: Bowel sounds are normal. There is no distension.     Palpations: Abdomen is soft. There is no hepatomegaly, splenomegaly or mass.     Tenderness: There is no abdominal tenderness.  Musculoskeletal:        General: Normal range of motion.     Cervical back: Normal range of motion and neck supple.     Right lower leg: No edema.     Left lower leg: No edema.  Lymphadenopathy:     Cervical: No cervical adenopathy.  Skin:    General: Skin is warm and dry.     Comments: She has some excoriated areas of the skin and one in the mid lower back which is slightly raised.   Neurological:     General: No focal deficit present.     Mental Status: She is alert and oriented to person, place, and time. Mental status is at baseline.  Psychiatric:        Mood and Affect: Mood normal.        Behavior: Behavior normal.        Thought Content: Thought content normal.        Judgment: Judgment normal.     LABS:   CBC Latest Ref Rng & Units 05/22/2018 09/12/2017 02/13/2017  WBC 4.0 - 10.5 K/uL 4.8 6.8 5.2  Hemoglobin 12.0 - 15.0 g/dL 12.4 12.7 13.0  Hematocrit 36.0 - 46.0 % 37.9 39.4 40.3  Platelets 150 - 400 K/uL 250 241 168   CMP Latest Ref Rng & Units 05/22/2018 09/12/2017 02/23/2017  Glucose 70 -  99 mg/dL 150(H) 358(H) 199(H)  BUN 8 - 23 mg/dL '16 18 11  ' Creatinine 0.44 - 1.00 mg/dL 0.79 0.97 0.83  Sodium 135 - 145 mmol/L 143 137 144  Potassium 3.5 - 5.1 mmol/L 4.2 3.9 4.5  Chloride 98 - 111 mmol/L 110 101 107(H)  CO2 22 - 32 mmol/L '24 23 22  ' Calcium 8.9 - 10.3 mg/dL 9.3 9.0 9.4  Total Protein 6.5 - 8.1 g/dL 6.6 6.5 5.7(L)  Total Bilirubin 0.3 - 1.2 mg/dL 0.5 0.4 <0.2  Alkaline Phos 38 - 126 U/L 50 52 48  AST 15 - 41 U/L '20 23 17  ' ALT 0 - 44 U/L '17 17 14      ' Lab Results  Component Value Date   FERRITIN 54 02/23/2017     STUDIES:   EXAM: 03/11/2020 DIGITAL DIAGNOSTIC BILATERAL MAMMOGRAM WITH TOMO AND CAD  COMPARISON:  Previous exam(s).  ACR Breast Density Category b: There are scattered areas of fibroglandular density.  FINDINGS: Standard 2D and tomosynthesis full field CC and MLO views of both breasts were obtained. Standard spot magnification CC view of the lumpectomy site in the RIGHT breast was also obtained.  Post surgical scar/architectural distortion at the lumpectomy site in the OUTER RIGHT breast at POSTERIOR depth. No new or suspicious findings in the RIGHT breast.  No findings suspicious for malignancy in the LEFT breast.  Mammographic images were processed with CAD.  IMPRESSION: 1. No mammographic evidence of malignancy  involving either breast. 2. Expected post lumpectomy changes involving the RIGHT breast.  RECOMMENDATION: As the patient is now 5 years out from her lumpectomy, she may return to annual screening. Screening mammogram in one year is recommended.(Code:SM-B-01Y)   Allergies: No Known Allergies  Current Medications: Current Outpatient Medications  Medication Sig Dispense Refill  . insulin glargine (LANTUS SOLOSTAR) 100 UNIT/ML Solostar Pen 65 UNITS    . ALPRAZolam (XANAX) 1 MG tablet Take 1 mg by mouth at bedtime as needed for sleep. Reported on 11/26/2015    . cetirizine (ZYRTEC) 10 MG tablet Take 10 mg by mouth daily.    . cholecalciferol (VITAMIN D) 1000 units tablet Take 1,000 Units by mouth daily.    . clonazePAM (KLONOPIN) 1 MG tablet Take 1 mg by mouth 2 (two) times daily.     . cloNIDine (CATAPRES) 0.1 MG tablet Take 0.1 mg by mouth daily.    . cyanocobalamin (,VITAMIN B-12,) 1000 MCG/ML injection Inject 1,000 mcg into the muscle every 30 (thirty) days.    . fluticasone (FLONASE) 50 MCG/ACT nasal spray Place 2 sprays into both nostrils daily.    Marland Kitchen glipiZIDE (GLUCOTROL XL) 5 MG 24 hr tablet TK 1 T PO QD  4  . hydrochlorothiazide (HYDRODIURIL) 25 MG tablet Take 25 mg by mouth daily.    . insulin lispro (HUMALOG) 100 UNIT/ML KwikPen SMARTSIG:8 Unit(s) SUB-Q Twice Daily    . lisinopril (ZESTRIL) 40 MG tablet Take 40 mg by mouth daily.    . metFORMIN (GLUCOPHAGE) 1000 MG tablet Take 1,000 mg by mouth 2 (two) times daily with a meal. Alternates 1-2 a day    . Multiple Minerals-Vitamins (CALCIUM & VIT D3 BONE HEALTH PO) Take 1 tablet by mouth daily.    . simvastatin (ZOCOR) 40 MG tablet Take 40 mg by mouth every evening. Reported on 09/25/2015    . tamoxifen (NOLVADEX) 20 MG tablet Take 1 tablet (20 mg total) by mouth daily. 90 tablet 3  . traZODone (DESYREL) 100 MG tablet Take 100-200 mg  by mouth at bedtime.     Marland Kitchen venlafaxine XR (EFFEXOR-XR) 75 MG 24 hr capsule 3  qam 90 capsule 3  .  XARELTO 20 MG TABS tablet Take 20 mg by mouth daily.  12   No current facility-administered medications for this visit.     ASSESSMENT & PLAN:   Assessment:   1. Stage IA invasive right breast cancer diagnosed in 2016 and treated with surgery, radiation and partial hormonal therapy.  She has no evidence of disease.  2. Severe hot flashes despite Effexor at 150 mg daily.  We have added clonidine 0.1 mg daily.  3. 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  She is currently on Xarelto and is negative for any hereditary hypercoagulability.  4. Chronic depression which is worsened by the fact that she is now widowed.  5. Hip pain which appears to be osteoarthritis.    6. Intermittent dizziness and headaches.  I will obtain CT head for further evaluation and try her on Antivert.    Plan: With her current symptoms of dizziness and headaches, I will obtain CT head imaging for further evaluation and I will call her with the results.  I will also try her on Antivert.  We will schedule her annual bilateral mammogram in August.  Otherwise, I will see her back in 1 year for repeat evaluation.  She understands and agrees with this plan of care.     I provided 15 minutes of face-to-face time during this this encounter and > 50% was spent counseling as documented under my assessment and plan.    Derwood Kaplan, MD [facility] [address] [place] [address] Dept: [phone] Dept Fax: [phone]   I, Rita Ohara, am acting as scribe for Derwood Kaplan, MD  I have reviewed this report as typed by the medical scribe, and it is complete and accurate.  Hermina Barters

## 2020-10-15 ENCOUNTER — Inpatient Hospital Stay: Payer: Medicare Other | Attending: Oncology | Admitting: Oncology

## 2020-10-15 ENCOUNTER — Other Ambulatory Visit: Payer: Self-pay | Admitting: Oncology

## 2020-10-15 ENCOUNTER — Encounter: Payer: Self-pay | Admitting: Oncology

## 2020-10-15 ENCOUNTER — Other Ambulatory Visit: Payer: Self-pay

## 2020-10-15 VITALS — Temp 98.4°F | Resp 18 | Ht 64.0 in | Wt 167.1 lb

## 2020-10-15 DIAGNOSIS — C50511 Malignant neoplasm of lower-outer quadrant of right female breast: Secondary | ICD-10-CM

## 2020-10-15 DIAGNOSIS — Z170 Estrogen receptor positive status [ER+]: Secondary | ICD-10-CM

## 2020-10-15 DIAGNOSIS — R42 Dizziness and giddiness: Secondary | ICD-10-CM | POA: Diagnosis not present

## 2020-10-15 DIAGNOSIS — Z1239 Encounter for other screening for malignant neoplasm of breast: Secondary | ICD-10-CM

## 2020-10-15 MED ORDER — MECLIZINE HCL 25 MG PO TABS: 25.0000 mg | ORAL_TABLET | Freq: Three times a day (TID) | ORAL | 5 refills | Status: AC | PRN

## 2020-11-25 DIAGNOSIS — E1159 Type 2 diabetes mellitus with other circulatory complications: Secondary | ICD-10-CM | POA: Diagnosis not present

## 2020-11-25 DIAGNOSIS — G5620 Lesion of ulnar nerve, unspecified upper limb: Secondary | ICD-10-CM | POA: Diagnosis not present

## 2020-11-25 DIAGNOSIS — I82409 Acute embolism and thrombosis of unspecified deep veins of unspecified lower extremity: Secondary | ICD-10-CM | POA: Diagnosis not present

## 2020-11-25 DIAGNOSIS — Z79899 Other long term (current) drug therapy: Secondary | ICD-10-CM | POA: Diagnosis not present

## 2020-11-25 DIAGNOSIS — I10 Essential (primary) hypertension: Secondary | ICD-10-CM | POA: Diagnosis not present

## 2020-12-03 DIAGNOSIS — R7309 Other abnormal glucose: Secondary | ICD-10-CM | POA: Diagnosis not present

## 2020-12-03 DIAGNOSIS — E785 Hyperlipidemia, unspecified: Secondary | ICD-10-CM | POA: Diagnosis not present

## 2020-12-03 DIAGNOSIS — I82401 Acute embolism and thrombosis of unspecified deep veins of right lower extremity: Secondary | ICD-10-CM | POA: Diagnosis not present

## 2020-12-03 DIAGNOSIS — I10 Essential (primary) hypertension: Secondary | ICD-10-CM | POA: Diagnosis not present

## 2020-12-03 DIAGNOSIS — E1122 Type 2 diabetes mellitus with diabetic chronic kidney disease: Secondary | ICD-10-CM | POA: Diagnosis not present

## 2021-02-14 DIAGNOSIS — E1122 Type 2 diabetes mellitus with diabetic chronic kidney disease: Secondary | ICD-10-CM | POA: Diagnosis not present

## 2021-02-14 DIAGNOSIS — E785 Hyperlipidemia, unspecified: Secondary | ICD-10-CM | POA: Diagnosis not present

## 2021-02-25 DIAGNOSIS — I10 Essential (primary) hypertension: Secondary | ICD-10-CM | POA: Diagnosis not present

## 2021-02-25 DIAGNOSIS — E785 Hyperlipidemia, unspecified: Secondary | ICD-10-CM | POA: Diagnosis not present

## 2021-02-25 DIAGNOSIS — E1129 Type 2 diabetes mellitus with other diabetic kidney complication: Secondary | ICD-10-CM | POA: Diagnosis not present

## 2021-02-25 DIAGNOSIS — Z79899 Other long term (current) drug therapy: Secondary | ICD-10-CM | POA: Diagnosis not present

## 2021-03-04 DIAGNOSIS — I10 Essential (primary) hypertension: Secondary | ICD-10-CM | POA: Diagnosis not present

## 2021-03-04 DIAGNOSIS — E1122 Type 2 diabetes mellitus with diabetic chronic kidney disease: Secondary | ICD-10-CM | POA: Diagnosis not present

## 2021-03-04 DIAGNOSIS — I82401 Acute embolism and thrombosis of unspecified deep veins of right lower extremity: Secondary | ICD-10-CM | POA: Diagnosis not present

## 2021-03-04 DIAGNOSIS — R7309 Other abnormal glucose: Secondary | ICD-10-CM | POA: Diagnosis not present

## 2021-03-16 DIAGNOSIS — J342 Deviated nasal septum: Secondary | ICD-10-CM | POA: Diagnosis not present

## 2021-03-16 DIAGNOSIS — R251 Tremor, unspecified: Secondary | ICD-10-CM | POA: Diagnosis not present

## 2021-03-16 DIAGNOSIS — H9203 Otalgia, bilateral: Secondary | ICD-10-CM | POA: Diagnosis not present

## 2021-03-16 DIAGNOSIS — H9190 Unspecified hearing loss, unspecified ear: Secondary | ICD-10-CM | POA: Diagnosis not present

## 2021-03-16 DIAGNOSIS — H93299 Other abnormal auditory perceptions, unspecified ear: Secondary | ICD-10-CM | POA: Diagnosis not present

## 2021-03-26 DIAGNOSIS — M7060 Trochanteric bursitis, unspecified hip: Secondary | ICD-10-CM | POA: Diagnosis not present

## 2021-03-26 DIAGNOSIS — M7061 Trochanteric bursitis, right hip: Secondary | ICD-10-CM | POA: Diagnosis not present

## 2021-03-26 DIAGNOSIS — M7062 Trochanteric bursitis, left hip: Secondary | ICD-10-CM | POA: Diagnosis not present

## 2021-04-20 DIAGNOSIS — M7062 Trochanteric bursitis, left hip: Secondary | ICD-10-CM | POA: Diagnosis not present

## 2021-06-09 DIAGNOSIS — E1129 Type 2 diabetes mellitus with other diabetic kidney complication: Secondary | ICD-10-CM | POA: Diagnosis not present

## 2021-06-14 DIAGNOSIS — R7309 Other abnormal glucose: Secondary | ICD-10-CM | POA: Diagnosis not present

## 2021-06-14 DIAGNOSIS — E1122 Type 2 diabetes mellitus with diabetic chronic kidney disease: Secondary | ICD-10-CM | POA: Diagnosis not present

## 2021-06-14 DIAGNOSIS — I10 Essential (primary) hypertension: Secondary | ICD-10-CM | POA: Diagnosis not present

## 2021-06-14 DIAGNOSIS — Z23 Encounter for immunization: Secondary | ICD-10-CM | POA: Diagnosis not present

## 2021-06-14 DIAGNOSIS — D6869 Other thrombophilia: Secondary | ICD-10-CM | POA: Diagnosis not present

## 2021-06-18 DIAGNOSIS — Z20822 Contact with and (suspected) exposure to covid-19: Secondary | ICD-10-CM | POA: Diagnosis not present

## 2021-06-18 DIAGNOSIS — R6883 Chills (without fever): Secondary | ICD-10-CM | POA: Diagnosis not present

## 2021-06-18 DIAGNOSIS — R531 Weakness: Secondary | ICD-10-CM | POA: Diagnosis not present

## 2021-06-27 DIAGNOSIS — Z1211 Encounter for screening for malignant neoplasm of colon: Secondary | ICD-10-CM | POA: Diagnosis not present

## 2021-08-18 DIAGNOSIS — E119 Type 2 diabetes mellitus without complications: Secondary | ICD-10-CM | POA: Diagnosis not present

## 2021-08-18 DIAGNOSIS — K5900 Constipation, unspecified: Secondary | ICD-10-CM | POA: Diagnosis not present

## 2021-08-18 DIAGNOSIS — R1084 Generalized abdominal pain: Secondary | ICD-10-CM | POA: Diagnosis not present

## 2021-08-18 DIAGNOSIS — Z794 Long term (current) use of insulin: Secondary | ICD-10-CM | POA: Diagnosis not present

## 2021-08-18 DIAGNOSIS — R109 Unspecified abdominal pain: Secondary | ICD-10-CM | POA: Diagnosis not present

## 2021-09-06 DIAGNOSIS — Z79899 Other long term (current) drug therapy: Secondary | ICD-10-CM | POA: Diagnosis not present

## 2021-09-06 DIAGNOSIS — E1129 Type 2 diabetes mellitus with other diabetic kidney complication: Secondary | ICD-10-CM | POA: Diagnosis not present

## 2021-09-11 DIAGNOSIS — E538 Deficiency of other specified B group vitamins: Secondary | ICD-10-CM | POA: Diagnosis not present

## 2021-09-11 DIAGNOSIS — E119 Type 2 diabetes mellitus without complications: Secondary | ICD-10-CM | POA: Diagnosis not present

## 2021-09-11 DIAGNOSIS — Z7901 Long term (current) use of anticoagulants: Secondary | ICD-10-CM | POA: Diagnosis not present

## 2021-09-11 DIAGNOSIS — Z8673 Personal history of transient ischemic attack (TIA), and cerebral infarction without residual deficits: Secondary | ICD-10-CM | POA: Diagnosis not present

## 2021-09-11 DIAGNOSIS — Z86718 Personal history of other venous thrombosis and embolism: Secondary | ICD-10-CM | POA: Diagnosis not present

## 2021-09-11 DIAGNOSIS — I10 Essential (primary) hypertension: Secondary | ICD-10-CM | POA: Diagnosis not present

## 2021-09-11 DIAGNOSIS — Z79899 Other long term (current) drug therapy: Secondary | ICD-10-CM | POA: Diagnosis not present

## 2021-09-11 DIAGNOSIS — R42 Dizziness and giddiness: Secondary | ICD-10-CM | POA: Diagnosis not present

## 2021-09-11 DIAGNOSIS — Z794 Long term (current) use of insulin: Secondary | ICD-10-CM | POA: Diagnosis not present

## 2021-09-11 DIAGNOSIS — R2990 Unspecified symptoms and signs involving the nervous system: Secondary | ICD-10-CM | POA: Diagnosis not present

## 2021-09-12 DIAGNOSIS — R42 Dizziness and giddiness: Secondary | ICD-10-CM | POA: Diagnosis not present

## 2021-09-12 DIAGNOSIS — R2990 Unspecified symptoms and signs involving the nervous system: Secondary | ICD-10-CM | POA: Diagnosis not present

## 2021-09-14 DIAGNOSIS — Z853 Personal history of malignant neoplasm of breast: Secondary | ICD-10-CM | POA: Diagnosis not present

## 2021-09-14 DIAGNOSIS — Z7984 Long term (current) use of oral hypoglycemic drugs: Secondary | ICD-10-CM | POA: Diagnosis not present

## 2021-09-14 DIAGNOSIS — I10 Essential (primary) hypertension: Secondary | ICD-10-CM | POA: Diagnosis not present

## 2021-09-14 DIAGNOSIS — Z7901 Long term (current) use of anticoagulants: Secondary | ICD-10-CM | POA: Diagnosis not present

## 2021-09-14 DIAGNOSIS — R42 Dizziness and giddiness: Secondary | ICD-10-CM | POA: Diagnosis not present

## 2021-09-14 DIAGNOSIS — Z794 Long term (current) use of insulin: Secondary | ICD-10-CM | POA: Diagnosis not present

## 2021-09-14 DIAGNOSIS — F32A Depression, unspecified: Secondary | ICD-10-CM | POA: Diagnosis not present

## 2021-09-14 DIAGNOSIS — E119 Type 2 diabetes mellitus without complications: Secondary | ICD-10-CM | POA: Diagnosis not present

## 2021-09-14 DIAGNOSIS — E7800 Pure hypercholesterolemia, unspecified: Secondary | ICD-10-CM | POA: Diagnosis not present

## 2021-09-18 DIAGNOSIS — Z794 Long term (current) use of insulin: Secondary | ICD-10-CM | POA: Diagnosis not present

## 2021-09-18 DIAGNOSIS — Z7984 Long term (current) use of oral hypoglycemic drugs: Secondary | ICD-10-CM | POA: Diagnosis not present

## 2021-09-18 DIAGNOSIS — R42 Dizziness and giddiness: Secondary | ICD-10-CM | POA: Diagnosis not present

## 2021-09-18 DIAGNOSIS — E119 Type 2 diabetes mellitus without complications: Secondary | ICD-10-CM | POA: Diagnosis not present

## 2021-09-18 DIAGNOSIS — I10 Essential (primary) hypertension: Secondary | ICD-10-CM | POA: Diagnosis not present

## 2021-09-18 DIAGNOSIS — F32A Depression, unspecified: Secondary | ICD-10-CM | POA: Diagnosis not present

## 2021-09-18 DIAGNOSIS — Z7901 Long term (current) use of anticoagulants: Secondary | ICD-10-CM | POA: Diagnosis not present

## 2021-09-18 DIAGNOSIS — E7800 Pure hypercholesterolemia, unspecified: Secondary | ICD-10-CM | POA: Diagnosis not present

## 2021-09-18 DIAGNOSIS — Z853 Personal history of malignant neoplasm of breast: Secondary | ICD-10-CM | POA: Diagnosis not present

## 2021-09-21 DIAGNOSIS — Z7901 Long term (current) use of anticoagulants: Secondary | ICD-10-CM | POA: Diagnosis not present

## 2021-09-21 DIAGNOSIS — E7800 Pure hypercholesterolemia, unspecified: Secondary | ICD-10-CM | POA: Diagnosis not present

## 2021-09-21 DIAGNOSIS — R42 Dizziness and giddiness: Secondary | ICD-10-CM | POA: Diagnosis not present

## 2021-09-21 DIAGNOSIS — F32A Depression, unspecified: Secondary | ICD-10-CM | POA: Diagnosis not present

## 2021-09-21 DIAGNOSIS — I10 Essential (primary) hypertension: Secondary | ICD-10-CM | POA: Diagnosis not present

## 2021-09-21 DIAGNOSIS — E119 Type 2 diabetes mellitus without complications: Secondary | ICD-10-CM | POA: Diagnosis not present

## 2021-09-21 DIAGNOSIS — Z794 Long term (current) use of insulin: Secondary | ICD-10-CM | POA: Diagnosis not present

## 2021-09-21 DIAGNOSIS — Z853 Personal history of malignant neoplasm of breast: Secondary | ICD-10-CM | POA: Diagnosis not present

## 2021-09-21 DIAGNOSIS — Z7984 Long term (current) use of oral hypoglycemic drugs: Secondary | ICD-10-CM | POA: Diagnosis not present

## 2021-09-23 DIAGNOSIS — Z7984 Long term (current) use of oral hypoglycemic drugs: Secondary | ICD-10-CM | POA: Diagnosis not present

## 2021-09-23 DIAGNOSIS — I10 Essential (primary) hypertension: Secondary | ICD-10-CM | POA: Diagnosis not present

## 2021-09-23 DIAGNOSIS — Z794 Long term (current) use of insulin: Secondary | ICD-10-CM | POA: Diagnosis not present

## 2021-09-23 DIAGNOSIS — E7800 Pure hypercholesterolemia, unspecified: Secondary | ICD-10-CM | POA: Diagnosis not present

## 2021-09-23 DIAGNOSIS — R42 Dizziness and giddiness: Secondary | ICD-10-CM | POA: Diagnosis not present

## 2021-09-23 DIAGNOSIS — F32A Depression, unspecified: Secondary | ICD-10-CM | POA: Diagnosis not present

## 2021-09-23 DIAGNOSIS — Z853 Personal history of malignant neoplasm of breast: Secondary | ICD-10-CM | POA: Diagnosis not present

## 2021-09-23 DIAGNOSIS — E119 Type 2 diabetes mellitus without complications: Secondary | ICD-10-CM | POA: Diagnosis not present

## 2021-09-23 DIAGNOSIS — Z7901 Long term (current) use of anticoagulants: Secondary | ICD-10-CM | POA: Diagnosis not present

## 2021-09-27 DIAGNOSIS — F32A Depression, unspecified: Secondary | ICD-10-CM | POA: Diagnosis not present

## 2021-09-27 DIAGNOSIS — Z794 Long term (current) use of insulin: Secondary | ICD-10-CM | POA: Diagnosis not present

## 2021-09-27 DIAGNOSIS — E119 Type 2 diabetes mellitus without complications: Secondary | ICD-10-CM | POA: Diagnosis not present

## 2021-09-27 DIAGNOSIS — I10 Essential (primary) hypertension: Secondary | ICD-10-CM | POA: Diagnosis not present

## 2021-09-27 DIAGNOSIS — Z7901 Long term (current) use of anticoagulants: Secondary | ICD-10-CM | POA: Diagnosis not present

## 2021-09-27 DIAGNOSIS — E7800 Pure hypercholesterolemia, unspecified: Secondary | ICD-10-CM | POA: Diagnosis not present

## 2021-09-27 DIAGNOSIS — R42 Dizziness and giddiness: Secondary | ICD-10-CM | POA: Diagnosis not present

## 2021-09-27 DIAGNOSIS — Z7984 Long term (current) use of oral hypoglycemic drugs: Secondary | ICD-10-CM | POA: Diagnosis not present

## 2021-09-27 DIAGNOSIS — Z853 Personal history of malignant neoplasm of breast: Secondary | ICD-10-CM | POA: Diagnosis not present

## 2021-10-01 DIAGNOSIS — F32A Depression, unspecified: Secondary | ICD-10-CM | POA: Diagnosis not present

## 2021-10-01 DIAGNOSIS — I10 Essential (primary) hypertension: Secondary | ICD-10-CM | POA: Diagnosis not present

## 2021-10-01 DIAGNOSIS — Z7901 Long term (current) use of anticoagulants: Secondary | ICD-10-CM | POA: Diagnosis not present

## 2021-10-01 DIAGNOSIS — Z853 Personal history of malignant neoplasm of breast: Secondary | ICD-10-CM | POA: Diagnosis not present

## 2021-10-01 DIAGNOSIS — Z7984 Long term (current) use of oral hypoglycemic drugs: Secondary | ICD-10-CM | POA: Diagnosis not present

## 2021-10-01 DIAGNOSIS — R42 Dizziness and giddiness: Secondary | ICD-10-CM | POA: Diagnosis not present

## 2021-10-01 DIAGNOSIS — E7800 Pure hypercholesterolemia, unspecified: Secondary | ICD-10-CM | POA: Diagnosis not present

## 2021-10-01 DIAGNOSIS — E119 Type 2 diabetes mellitus without complications: Secondary | ICD-10-CM | POA: Diagnosis not present

## 2021-10-01 DIAGNOSIS — Z794 Long term (current) use of insulin: Secondary | ICD-10-CM | POA: Diagnosis not present

## 2021-10-05 DIAGNOSIS — Z7984 Long term (current) use of oral hypoglycemic drugs: Secondary | ICD-10-CM | POA: Diagnosis not present

## 2021-10-05 DIAGNOSIS — I10 Essential (primary) hypertension: Secondary | ICD-10-CM | POA: Diagnosis not present

## 2021-10-05 DIAGNOSIS — Z794 Long term (current) use of insulin: Secondary | ICD-10-CM | POA: Diagnosis not present

## 2021-10-05 DIAGNOSIS — F32A Depression, unspecified: Secondary | ICD-10-CM | POA: Diagnosis not present

## 2021-10-05 DIAGNOSIS — Z7901 Long term (current) use of anticoagulants: Secondary | ICD-10-CM | POA: Diagnosis not present

## 2021-10-05 DIAGNOSIS — R42 Dizziness and giddiness: Secondary | ICD-10-CM | POA: Diagnosis not present

## 2021-10-05 DIAGNOSIS — E7800 Pure hypercholesterolemia, unspecified: Secondary | ICD-10-CM | POA: Diagnosis not present

## 2021-10-05 DIAGNOSIS — E119 Type 2 diabetes mellitus without complications: Secondary | ICD-10-CM | POA: Diagnosis not present

## 2021-10-05 DIAGNOSIS — Z853 Personal history of malignant neoplasm of breast: Secondary | ICD-10-CM | POA: Diagnosis not present

## 2021-10-07 DIAGNOSIS — Z7984 Long term (current) use of oral hypoglycemic drugs: Secondary | ICD-10-CM | POA: Diagnosis not present

## 2021-10-07 DIAGNOSIS — E119 Type 2 diabetes mellitus without complications: Secondary | ICD-10-CM | POA: Diagnosis not present

## 2021-10-07 DIAGNOSIS — Z794 Long term (current) use of insulin: Secondary | ICD-10-CM | POA: Diagnosis not present

## 2021-10-07 DIAGNOSIS — I10 Essential (primary) hypertension: Secondary | ICD-10-CM | POA: Diagnosis not present

## 2021-10-07 DIAGNOSIS — E7800 Pure hypercholesterolemia, unspecified: Secondary | ICD-10-CM | POA: Diagnosis not present

## 2021-10-07 DIAGNOSIS — Z853 Personal history of malignant neoplasm of breast: Secondary | ICD-10-CM | POA: Diagnosis not present

## 2021-10-07 DIAGNOSIS — Z7901 Long term (current) use of anticoagulants: Secondary | ICD-10-CM | POA: Diagnosis not present

## 2021-10-07 DIAGNOSIS — F32A Depression, unspecified: Secondary | ICD-10-CM | POA: Diagnosis not present

## 2021-10-07 DIAGNOSIS — R42 Dizziness and giddiness: Secondary | ICD-10-CM | POA: Diagnosis not present

## 2021-10-11 DIAGNOSIS — Z853 Personal history of malignant neoplasm of breast: Secondary | ICD-10-CM | POA: Diagnosis not present

## 2021-10-11 DIAGNOSIS — Z794 Long term (current) use of insulin: Secondary | ICD-10-CM | POA: Diagnosis not present

## 2021-10-11 DIAGNOSIS — E119 Type 2 diabetes mellitus without complications: Secondary | ICD-10-CM | POA: Diagnosis not present

## 2021-10-11 DIAGNOSIS — Z7901 Long term (current) use of anticoagulants: Secondary | ICD-10-CM | POA: Diagnosis not present

## 2021-10-11 DIAGNOSIS — Z7984 Long term (current) use of oral hypoglycemic drugs: Secondary | ICD-10-CM | POA: Diagnosis not present

## 2021-10-11 DIAGNOSIS — E7800 Pure hypercholesterolemia, unspecified: Secondary | ICD-10-CM | POA: Diagnosis not present

## 2021-10-11 DIAGNOSIS — F32A Depression, unspecified: Secondary | ICD-10-CM | POA: Diagnosis not present

## 2021-10-11 DIAGNOSIS — I10 Essential (primary) hypertension: Secondary | ICD-10-CM | POA: Diagnosis not present

## 2021-10-11 DIAGNOSIS — R42 Dizziness and giddiness: Secondary | ICD-10-CM | POA: Diagnosis not present

## 2021-10-13 DIAGNOSIS — F32A Depression, unspecified: Secondary | ICD-10-CM | POA: Diagnosis not present

## 2021-10-13 DIAGNOSIS — I10 Essential (primary) hypertension: Secondary | ICD-10-CM | POA: Diagnosis not present

## 2021-10-13 DIAGNOSIS — Z794 Long term (current) use of insulin: Secondary | ICD-10-CM | POA: Diagnosis not present

## 2021-10-13 DIAGNOSIS — Z853 Personal history of malignant neoplasm of breast: Secondary | ICD-10-CM | POA: Diagnosis not present

## 2021-10-13 DIAGNOSIS — R42 Dizziness and giddiness: Secondary | ICD-10-CM | POA: Diagnosis not present

## 2021-10-13 DIAGNOSIS — E7800 Pure hypercholesterolemia, unspecified: Secondary | ICD-10-CM | POA: Diagnosis not present

## 2021-10-13 DIAGNOSIS — Z7901 Long term (current) use of anticoagulants: Secondary | ICD-10-CM | POA: Diagnosis not present

## 2021-10-13 DIAGNOSIS — E119 Type 2 diabetes mellitus without complications: Secondary | ICD-10-CM | POA: Diagnosis not present

## 2021-10-13 DIAGNOSIS — Z7984 Long term (current) use of oral hypoglycemic drugs: Secondary | ICD-10-CM | POA: Diagnosis not present

## 2021-10-15 DIAGNOSIS — E1122 Type 2 diabetes mellitus with diabetic chronic kidney disease: Secondary | ICD-10-CM | POA: Diagnosis not present

## 2021-10-15 DIAGNOSIS — R7309 Other abnormal glucose: Secondary | ICD-10-CM | POA: Diagnosis not present

## 2021-10-15 DIAGNOSIS — D696 Thrombocytopenia, unspecified: Secondary | ICD-10-CM | POA: Diagnosis not present

## 2021-10-15 DIAGNOSIS — I10 Essential (primary) hypertension: Secondary | ICD-10-CM | POA: Diagnosis not present

## 2021-10-22 DIAGNOSIS — E119 Type 2 diabetes mellitus without complications: Secondary | ICD-10-CM | POA: Diagnosis not present

## 2021-10-22 DIAGNOSIS — F32A Depression, unspecified: Secondary | ICD-10-CM | POA: Diagnosis not present

## 2021-10-22 DIAGNOSIS — Z7901 Long term (current) use of anticoagulants: Secondary | ICD-10-CM | POA: Diagnosis not present

## 2021-10-22 DIAGNOSIS — Z853 Personal history of malignant neoplasm of breast: Secondary | ICD-10-CM | POA: Diagnosis not present

## 2021-10-22 DIAGNOSIS — I10 Essential (primary) hypertension: Secondary | ICD-10-CM | POA: Diagnosis not present

## 2021-10-22 DIAGNOSIS — E7800 Pure hypercholesterolemia, unspecified: Secondary | ICD-10-CM | POA: Diagnosis not present

## 2021-10-22 DIAGNOSIS — Z794 Long term (current) use of insulin: Secondary | ICD-10-CM | POA: Diagnosis not present

## 2021-10-22 DIAGNOSIS — Z7984 Long term (current) use of oral hypoglycemic drugs: Secondary | ICD-10-CM | POA: Diagnosis not present

## 2021-10-22 DIAGNOSIS — R42 Dizziness and giddiness: Secondary | ICD-10-CM | POA: Diagnosis not present

## 2021-10-25 DIAGNOSIS — M7061 Trochanteric bursitis, right hip: Secondary | ICD-10-CM | POA: Diagnosis not present

## 2021-10-26 DIAGNOSIS — F32A Depression, unspecified: Secondary | ICD-10-CM | POA: Diagnosis not present

## 2021-10-26 DIAGNOSIS — R42 Dizziness and giddiness: Secondary | ICD-10-CM | POA: Diagnosis not present

## 2021-10-26 DIAGNOSIS — Z7901 Long term (current) use of anticoagulants: Secondary | ICD-10-CM | POA: Diagnosis not present

## 2021-10-26 DIAGNOSIS — E7800 Pure hypercholesterolemia, unspecified: Secondary | ICD-10-CM | POA: Diagnosis not present

## 2021-10-26 DIAGNOSIS — Z7984 Long term (current) use of oral hypoglycemic drugs: Secondary | ICD-10-CM | POA: Diagnosis not present

## 2021-10-26 DIAGNOSIS — E119 Type 2 diabetes mellitus without complications: Secondary | ICD-10-CM | POA: Diagnosis not present

## 2021-10-26 DIAGNOSIS — Z794 Long term (current) use of insulin: Secondary | ICD-10-CM | POA: Diagnosis not present

## 2021-10-26 DIAGNOSIS — Z853 Personal history of malignant neoplasm of breast: Secondary | ICD-10-CM | POA: Diagnosis not present

## 2021-10-26 DIAGNOSIS — I10 Essential (primary) hypertension: Secondary | ICD-10-CM | POA: Diagnosis not present

## 2021-11-04 DIAGNOSIS — Z7901 Long term (current) use of anticoagulants: Secondary | ICD-10-CM | POA: Diagnosis not present

## 2021-11-04 DIAGNOSIS — Z794 Long term (current) use of insulin: Secondary | ICD-10-CM | POA: Diagnosis not present

## 2021-11-04 DIAGNOSIS — F32A Depression, unspecified: Secondary | ICD-10-CM | POA: Diagnosis not present

## 2021-11-04 DIAGNOSIS — I10 Essential (primary) hypertension: Secondary | ICD-10-CM | POA: Diagnosis not present

## 2021-11-04 DIAGNOSIS — E7800 Pure hypercholesterolemia, unspecified: Secondary | ICD-10-CM | POA: Diagnosis not present

## 2021-11-04 DIAGNOSIS — Z7984 Long term (current) use of oral hypoglycemic drugs: Secondary | ICD-10-CM | POA: Diagnosis not present

## 2021-11-04 DIAGNOSIS — Z853 Personal history of malignant neoplasm of breast: Secondary | ICD-10-CM | POA: Diagnosis not present

## 2021-11-04 DIAGNOSIS — E119 Type 2 diabetes mellitus without complications: Secondary | ICD-10-CM | POA: Diagnosis not present

## 2021-11-04 DIAGNOSIS — R42 Dizziness and giddiness: Secondary | ICD-10-CM | POA: Diagnosis not present

## 2021-11-09 DIAGNOSIS — Z853 Personal history of malignant neoplasm of breast: Secondary | ICD-10-CM | POA: Diagnosis not present

## 2021-11-09 DIAGNOSIS — Z7901 Long term (current) use of anticoagulants: Secondary | ICD-10-CM | POA: Diagnosis not present

## 2021-11-09 DIAGNOSIS — E7800 Pure hypercholesterolemia, unspecified: Secondary | ICD-10-CM | POA: Diagnosis not present

## 2021-11-09 DIAGNOSIS — E119 Type 2 diabetes mellitus without complications: Secondary | ICD-10-CM | POA: Diagnosis not present

## 2021-11-09 DIAGNOSIS — I10 Essential (primary) hypertension: Secondary | ICD-10-CM | POA: Diagnosis not present

## 2021-11-09 DIAGNOSIS — Z794 Long term (current) use of insulin: Secondary | ICD-10-CM | POA: Diagnosis not present

## 2021-11-09 DIAGNOSIS — F32A Depression, unspecified: Secondary | ICD-10-CM | POA: Diagnosis not present

## 2021-11-09 DIAGNOSIS — R42 Dizziness and giddiness: Secondary | ICD-10-CM | POA: Diagnosis not present

## 2021-11-09 DIAGNOSIS — Z7984 Long term (current) use of oral hypoglycemic drugs: Secondary | ICD-10-CM | POA: Diagnosis not present

## 2022-01-17 DIAGNOSIS — I10 Essential (primary) hypertension: Secondary | ICD-10-CM | POA: Diagnosis not present

## 2022-01-17 DIAGNOSIS — H814 Vertigo of central origin: Secondary | ICD-10-CM | POA: Diagnosis not present

## 2022-01-17 DIAGNOSIS — Z79899 Other long term (current) drug therapy: Secondary | ICD-10-CM | POA: Diagnosis not present

## 2022-01-17 DIAGNOSIS — E1129 Type 2 diabetes mellitus with other diabetic kidney complication: Secondary | ICD-10-CM | POA: Diagnosis not present

## 2022-01-17 DIAGNOSIS — M7061 Trochanteric bursitis, right hip: Secondary | ICD-10-CM | POA: Diagnosis not present

## 2022-01-17 DIAGNOSIS — M7062 Trochanteric bursitis, left hip: Secondary | ICD-10-CM | POA: Diagnosis not present

## 2022-01-17 DIAGNOSIS — D6869 Other thrombophilia: Secondary | ICD-10-CM | POA: Diagnosis not present

## 2022-01-24 DIAGNOSIS — E1122 Type 2 diabetes mellitus with diabetic chronic kidney disease: Secondary | ICD-10-CM | POA: Diagnosis not present

## 2022-01-24 DIAGNOSIS — E785 Hyperlipidemia, unspecified: Secondary | ICD-10-CM | POA: Diagnosis not present

## 2022-01-24 DIAGNOSIS — I10 Essential (primary) hypertension: Secondary | ICD-10-CM | POA: Diagnosis not present

## 2022-01-24 DIAGNOSIS — R7309 Other abnormal glucose: Secondary | ICD-10-CM | POA: Diagnosis not present

## 2022-04-15 DIAGNOSIS — R6889 Other general symptoms and signs: Secondary | ICD-10-CM | POA: Diagnosis not present

## 2022-04-15 DIAGNOSIS — R509 Fever, unspecified: Secondary | ICD-10-CM | POA: Diagnosis not present

## 2022-04-15 DIAGNOSIS — R110 Nausea: Secondary | ICD-10-CM | POA: Diagnosis not present

## 2022-04-15 DIAGNOSIS — U071 COVID-19: Secondary | ICD-10-CM | POA: Diagnosis not present

## 2022-04-15 DIAGNOSIS — Z743 Need for continuous supervision: Secondary | ICD-10-CM | POA: Diagnosis not present

## 2022-04-15 DIAGNOSIS — W19XXXA Unspecified fall, initial encounter: Secondary | ICD-10-CM | POA: Diagnosis not present

## 2022-04-15 DIAGNOSIS — E876 Hypokalemia: Secondary | ICD-10-CM | POA: Diagnosis not present

## 2022-04-15 DIAGNOSIS — R531 Weakness: Secondary | ICD-10-CM | POA: Diagnosis not present

## 2022-04-15 DIAGNOSIS — R9431 Abnormal electrocardiogram [ECG] [EKG]: Secondary | ICD-10-CM | POA: Diagnosis not present

## 2022-04-20 ENCOUNTER — Telehealth: Payer: Self-pay | Admitting: *Deleted

## 2022-04-20 NOTE — Telephone Encounter (Signed)
     Patient  visit on 04/15/2022  at [facility] was for treatment   Have you been able to follow up with your primary care physician? yes  The patient was or was not able to obtain any needed medicine or equipment.  Are there diet recommendations that you are having difficulty following?  Patient expresses understanding of discharge instructions and education provided has no other needs at this time.    [place] [phone] [address] , [place] [address] Email : [email or web address]

## 2022-04-27 DIAGNOSIS — M7062 Trochanteric bursitis, left hip: Secondary | ICD-10-CM | POA: Diagnosis not present

## 2022-04-27 DIAGNOSIS — M7061 Trochanteric bursitis, right hip: Secondary | ICD-10-CM | POA: Diagnosis not present

## 2022-05-13 DIAGNOSIS — E785 Hyperlipidemia, unspecified: Secondary | ICD-10-CM | POA: Insufficient documentation

## 2022-05-13 DIAGNOSIS — E119 Type 2 diabetes mellitus without complications: Secondary | ICD-10-CM | POA: Insufficient documentation

## 2022-05-13 DIAGNOSIS — R42 Dizziness and giddiness: Secondary | ICD-10-CM | POA: Insufficient documentation

## 2022-05-13 DIAGNOSIS — F32A Depression, unspecified: Secondary | ICD-10-CM | POA: Insufficient documentation

## 2022-05-13 DIAGNOSIS — K219 Gastro-esophageal reflux disease without esophagitis: Secondary | ICD-10-CM | POA: Insufficient documentation

## 2022-05-13 DIAGNOSIS — O2230 Deep phlebothrombosis in pregnancy, unspecified trimester: Secondary | ICD-10-CM | POA: Insufficient documentation

## 2022-05-13 DIAGNOSIS — F419 Anxiety disorder, unspecified: Secondary | ICD-10-CM | POA: Insufficient documentation

## 2022-05-15 NOTE — Progress Notes (Deleted)
Cardiology Office Note:    Date:  05/16/2022   ID:  Olivia Mora, DOB 27-Jul-1945, MRN [id number]  PCP:  [name], MD  Cardiologist:  Shirlee More, MD   Referring MD: [name], MD  ASSESSMENT:    1. Atrial fibrillation, unspecified type ([place])   2. Long term (current) use of anticoagulants   3. Nonrheumatic mitral valve regurgitation   4. Hyperlipidemia, unspecified hyperlipidemia type    PLAN:    In order of problems listed above:  ***  Next appointment   Medication Adjustments/Labs and Tests Ordered: Current medicines are reviewed at length with the patient today.  Concerns regarding medicines are outlined above.  No orders of the defined types were placed in this encounter.  No orders of the defined types were placed in this encounter.    ED note relates possible atrial fibrillation not documented.  History of Present Illness:    Olivia Mora is a 76 y.o. female with a history of previous DVT and pulmonary embolism seen by [facility] cardiology 11/26/2012 for management of anticoagulation who is being seen today for the evaluation of atrial fibrillation at the request of [name], MD.  Echocardiogram performed 2014 left ventricular ejection fraction low normal 50 to 55% mild to moderate mitral regurgitation.  She was seen at [facility] ED 04/15/2022 with COVID and fever of 102. Laboratory studies showed a potassium 3.3 creatinine 0.8 she is anemic with a hemoglobin at 10.9 troponin was assessed undetectable BNP level was mildly elevated 620. The EKG was interpreted as sinus rhythm but the patient was discharged with a diagnosis of paroxysmal atrial fibrillation.  I independently reviewed that EKG showing sinus rhythm marked baseline artifact otherwise normal.  Past Medical History:  Diagnosis Date   Abnormal EKG    Anxiety    Breast cancer ([place]) 2016   Right breast lumpectomy   Depression    Diabetes ([place])    DVT (deep vein thrombosis) in  pregnancy    GERD (gastroesophageal reflux disease)    Hyperlipidemia    Mitral regurgitation    mild/moderate, echo, 10/2011   Noncompliance 03/17/2016   Personal history of radiation therapy 2016   Vertigo     Past Surgical History:  Procedure Laterality Date   APPENDECTOMY     BREAST LUMPECTOMY Right 2016   BREAST LUMPECTOMY WITH RADIOACTIVE SEED AND SENTINEL LYMPH NODE BIOPSY Right 05/13/2015   Procedure:  RIGHT BREAST LUMPECTOMY WITH RADIOACTIVE SEED AND RIGHT SENTINEL LYMPH NODE BIOPSY;  Surgeon: [name], MD;  Location: [facility];  Service: General;  Laterality: Right;   BREAST LUMPECTOMY WITH RADIOACTIVE SEED LOCALIZATION Left 05/13/2015   Procedure: LEFT BREAST LUMPECTOMY WITH RADIOACTIVE SEED LOCALIZATION;  Surgeon: [name], MD;  Location: [facility];  Service: General;  Laterality: Left;   OTHER SURGICAL HISTORY     jaw   OTHER SURGICAL HISTORY     cyst removal jaw   TUBAL LIGATION      Current Medications: No outpatient medications have been marked as taking for the 05/16/22 encounter (Appointment) with Richardo Priest, MD.     Allergies:   Patient has no known allergies.   Social History   Socioeconomic History   Marital status: Married    Spouse name: Not on file   Number of children: 2   Years of education: Not on file   Highest education level: Not on file  Occupational History   Not on file  Tobacco Use  Smoking status: Never   Smokeless tobacco: Never  Substance and Sexual Activity   Alcohol use: No    Alcohol/week: 0.0 standard drinks of alcohol   Drug use: No   Sexual activity: Yes    Birth control/protection: Post-menopausal  Other Topics Concern   Not on file  Social History Narrative   Lives at home with husband (whose has had 5 strokes).  She is the main caregiver.  @ children.  She is retired.     Social Determinants of Health   Financial Resource Strain: Not on file  Food Insecurity: Not on file   Transportation Needs: Not on file  Physical Activity: Not on file  Stress: Not on file  Social Connections: Not on file     Family History: The patient's ***family history includes CAD in her unknown relative; Heart attack (age of onset: 77) in her brother; Heart disease in her unknown relative.  ROS:   ROS Please see the history of present illness.    *** All other systems reviewed and are negative.  EKGs/Labs/Other Studies Reviewed:    The following studies were reviewed today: ***  EKG:  EKG is *** ordered today.  The ekg ordered today is personally reviewed and demonstrates ***  Recent Labs: No results found for requested labs within last 365 days.  Recent Lipid Panel No results found for: "CHOL", "TRIG", "HDL", "CHOLHDL", "VLDL", "LDLCALC", "LDLDIRECT"  Physical Exam:    VS:  There were no vitals taken for this visit.    Wt Readings from Last 3 Encounters:  10/15/20 167 lb 1.6 oz (75.8 kg)  09/12/17 162 lb 1.6 oz (73.5 kg)  02/23/17 163 lb (73.9 kg)     GEN: *** Well nourished, well developed in no acute distress HEENT: Normal NECK: No JVD; No carotid bruits LYMPHATICS: No lymphadenopathy CARDIAC: ***RRR, no murmurs, rubs, gallops RESPIRATORY:  Clear to auscultation without rales, wheezing or rhonchi  ABDOMEN: Soft, non-tender, non-distended MUSCULOSKELETAL:  No edema; No deformity  SKIN: Warm and dry NEUROLOGIC:  Alert and oriented x 3 PSYCHIATRIC:  Normal affect     Signed, Shirlee More, MD  05/16/2022 1:59 PM    [facility]

## 2022-05-16 ENCOUNTER — Ambulatory Visit: Payer: Medicare Other | Attending: Cardiology | Admitting: Cardiology

## 2022-05-16 ENCOUNTER — Encounter: Payer: Self-pay | Admitting: Cardiology

## 2022-05-16 DIAGNOSIS — E1140 Type 2 diabetes mellitus with diabetic neuropathy, unspecified: Secondary | ICD-10-CM | POA: Diagnosis not present

## 2022-05-19 DIAGNOSIS — R7309 Other abnormal glucose: Secondary | ICD-10-CM | POA: Diagnosis not present

## 2022-05-19 DIAGNOSIS — E1140 Type 2 diabetes mellitus with diabetic neuropathy, unspecified: Secondary | ICD-10-CM | POA: Diagnosis not present

## 2022-05-19 DIAGNOSIS — U071 COVID-19: Secondary | ICD-10-CM | POA: Diagnosis not present

## 2022-06-02 NOTE — Progress Notes (Signed)
Cardiology Office Note:    Date:  06/03/2022   ID:  Alesia Banda, DOB [birth year], MRN [id number]  PCP:  [name], MD  Cardiologist:  Shirlee More, MD   Referring MD: [name], MD  ASSESSMENT:    1. APC (atrial premature contractions)   2. Long term (current) use of anticoagulants   3. Primary hypertension   4. Mixed hyperlipidemia   5. Type 2 diabetes mellitus without complication, with long-term current use of insulin (HCC)    PLAN:    In order of problems listed above:  The best explanation is that EKG in the emergency room was limited by her being acutely ill having rigors and baseline fever in the presence of APCs and a careful inspection she did not have atrial fibrillation she is quite relieved she is not having cardiovascular symptoms and I do not think she needs to have any repeat cardiac evaluation including monitoring or repeat echocardiogram at this time. We will continue her long-term anticoagulation for venous thromboembolism Blood pressure is good continue her current antihypertensives Continue her statin Stable managed by her PCP  Next appointment as needed   Medication Adjustments/Labs and Tests Ordered: Current medicines are reviewed at length with the patient today.  Concerns regarding medicines are outlined above.  Orders Placed This Encounter  Procedures   EKG 12-Lead   No orders of the defined types were placed in this encounter.   Chief complaint I was told I had atrial fibrillation in the ED and needed to see a cardiologist  History of Present Illness:    Olivia Mora is a 75 y.o. female with a history of previous pulmonary embolism and chronic anticoagulation who is being seen today for the evaluation of atrial fibrillation at the request of [name], MD.  There is a notation that she had questionable atrial fibrillation in the emergency room when seen 04/15/2022.  Independently reviewed the EKG that clearly shows sinus rhythm baseline  artifact but no acute changes.  She has a history of stage I right breast cancer.  She had an echocardiogram 2014 with [facility] normal left ventricular ejection fraction 50 to 55% normal left atrial size and was described as mild to moderate mitral regurgitation without pulmonary artery hypertension  She had COVID-19 infection 04/15/2022 and referred to [facility] ED for evaluation and treatment.  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  That visit was for COVID infection with fever and rigors temperature was 100.1 oxygen sat was 91% the description is that initially they thought she had atrial fibrillation with a competing junctional pacemaker.  The been a question of atrial fibrillation in the ED, fortunately was able to access an EKG with a low but it detective work she had a fever rigors has a baseline tremor had APCs very comfortable reviewing the EKG that it was not atrial fibrillation. She had an echocardiogram about a decade ago and had mild mitral regurgitation the mitral valve was normal no history of congenital rheumatic heart disease previous atrial fibrillation. Since COVID she finds herself to be continue to be weak but is not having edema shortness of breath chest pain or syncope. Past Medical History:  Diagnosis Date   Abnormal EKG    Anxiety    Breast cancer ([place]) 2016   Right breast lumpectomy   Depression  Diabetes ([place])    DVT (deep vein thrombosis) in pregnancy    GERD (gastroesophageal reflux disease)    Hyperlipidemia    Mitral regurgitation    mild/moderate, echo, 10/2011   Noncompliance 03/17/2016   Personal history of radiation therapy 2016   Vertigo     Past Surgical History:  Procedure Laterality Date   APPENDECTOMY     BREAST LUMPECTOMY Right  2016   BREAST LUMPECTOMY WITH RADIOACTIVE SEED AND SENTINEL LYMPH NODE BIOPSY Right 05/13/2015   Procedure:  RIGHT BREAST LUMPECTOMY WITH RADIOACTIVE SEED AND RIGHT SENTINEL LYMPH NODE BIOPSY;  Surgeon: [name], MD;  Location: [place];  Service: General;  Laterality: Right;   BREAST LUMPECTOMY WITH RADIOACTIVE SEED LOCALIZATION Left 05/13/2015   Procedure: LEFT BREAST LUMPECTOMY WITH RADIOACTIVE SEED LOCALIZATION;  Surgeon: [name], MD;  Location: [place];  Service: General;  Laterality: Left;   OTHER SURGICAL HISTORY     jaw   OTHER SURGICAL HISTORY     cyst removal jaw   TUBAL LIGATION      Current Medications: Current Meds  Medication Sig   ALPRAZolam (XANAX) 1 MG tablet Take 1 mg by mouth at bedtime as needed for sleep. Reported on 11/26/2015   cetirizine (ZYRTEC) 10 MG tablet Take 10 mg by mouth daily.   cholecalciferol (VITAMIN D) 1000 units tablet Take 1,000 Units by mouth daily.   clonazePAM (KLONOPIN) 1 MG tablet Take 1 mg by mouth 2 (two) times daily.    cloNIDine (CATAPRES) 0.1 MG tablet Take 0.1 mg by mouth daily.   cyanocobalamin (,VITAMIN B-12,) 1000 MCG/ML injection Inject 1,000 mcg into the muscle every 30 (thirty) days.   insulin glargine (LANTUS SOLOSTAR) 100 UNIT/ML Solostar Pen 65 UNITS   insulin lispro (HUMALOG) 100 UNIT/ML KwikPen SMARTSIG:8 Unit(s) SUB-Q Twice Daily   lisinopril (ZESTRIL) 40 MG tablet Take 40 mg by mouth daily.   meclizine (ANTIVERT) 25 MG tablet Take 1 tablet (25 mg total) by mouth 3 (three) times daily as needed for dizziness.   metFORMIN (GLUCOPHAGE) 1000 MG tablet Take 1,000 mg by mouth 2 (two) times daily with a meal. Alternates 1-2 a day   Multiple Minerals-Vitamins (CALCIUM & VIT D3 BONE HEALTH PO) Take 1 tablet by mouth daily.   simvastatin (ZOCOR) 40 MG tablet Take 40 mg by mouth every evening. Reported on 09/25/2015   traZODone (DESYREL) 100 MG tablet Take 100-200 mg by mouth at bedtime.     venlafaxine XR (EFFEXOR-XR) 75 MG 24 hr capsule 3  qam   XARELTO 20 MG TABS tablet Take 20 mg by mouth daily.     Allergies:   Patient has no known allergies.   Social History   Socioeconomic History   Marital status: Married    Spouse name: Not on file   Number of children: 2   Years of education: Not on file   Highest education level: Not on file  Occupational History   Not on file  Tobacco Use   Smoking status: Never   Smokeless tobacco: Never  Substance and Sexual Activity   Alcohol use: No    Alcohol/week: 0.0 standard drinks of alcohol   Drug use: No   Sexual activity: Yes    Birth control/protection: Post-menopausal  Other Topics Concern   Not on file  Social History Narrative   Lives at home with husband (whose has had 5 strokes).  She is the main caregiver.  @ children.  She is retired.  Social Determinants of Health   Financial Resource Strain: Not on file  Food Insecurity: Not on file  Transportation Needs: Not on file  Physical Activity: Not on file  Stress: Not on file  Social Connections: Not on file     Family History: The patient's family history includes CAD in her unknown relative; Heart attack (age of onset: 87) in her brother; Heart disease in her unknown relative.  ROS:   ROS Please see the history of present illness.     All other systems reviewed and are negative.  EKGs/Labs/Other Studies Reviewed:    The following studies were reviewed today:   EKG:  EKG is  ordered today.  The ekg ordered today is personally reviewed and demonstrates sinus rhythm and it is a normal EKG  Recent Labs:   Physical Exam:    VS:  BP 118/64 (BP Location: Right Arm, Patient Position: Sitting, Cuff Size: Normal)   Pulse 67   Ht '5\' 4"'$  (1.626 m)   Wt 169 lb (76.7 kg)   SpO2 97%   BMI 29.01 kg/m     Wt Readings from Last 3 Encounters:  06/03/22 169 lb (76.7 kg)  10/15/20 167 lb 1.6 oz (75.8 kg)  09/12/17 162 lb 1.6 oz (73.5 kg)     GEN: She has  a tremor well nourished, well developed in no acute distress HEENT: Normal NECK: No JVD; No carotid bruits LYMPHATICS: No lymphadenopathy CARDIAC: RRR, no murmurs, rubs, gallops RESPIRATORY:  Clear to auscultation without rales, wheezing or rhonchi  ABDOMEN: Soft, non-tender, non-distended MUSCULOSKELETAL:  No edema; No deformity  SKIN: Warm and dry NEUROLOGIC:  Alert and oriented x 3 PSYCHIATRIC:  Normal affect     Signed, Shirlee More, MD  06/03/2022 4:10 PM    [facility]

## 2022-06-03 ENCOUNTER — Ambulatory Visit: Payer: Medicare Other | Attending: Cardiology | Admitting: Cardiology

## 2022-06-03 ENCOUNTER — Encounter: Payer: Self-pay | Admitting: Cardiology

## 2022-06-03 VITALS — BP 118/64 | HR 67 | Ht 64.0 in | Wt 169.0 lb

## 2022-06-03 DIAGNOSIS — I10 Essential (primary) hypertension: Secondary | ICD-10-CM | POA: Diagnosis not present

## 2022-06-03 DIAGNOSIS — Z794 Long term (current) use of insulin: Secondary | ICD-10-CM | POA: Diagnosis not present

## 2022-06-03 DIAGNOSIS — E119 Type 2 diabetes mellitus without complications: Secondary | ICD-10-CM

## 2022-06-03 DIAGNOSIS — E782 Mixed hyperlipidemia: Secondary | ICD-10-CM | POA: Diagnosis not present

## 2022-06-03 DIAGNOSIS — I491 Atrial premature depolarization: Secondary | ICD-10-CM

## 2022-06-03 DIAGNOSIS — Z7901 Long term (current) use of anticoagulants: Secondary | ICD-10-CM | POA: Diagnosis not present

## 2022-06-03 NOTE — Patient Instructions (Signed)
Medication Instructions:  Your physician recommends that you continue on your current medications as directed. Please refer to the Current Medication list given to you today.  *If you need a refill on your cardiac medications before your next appointment, please call your pharmacy*   Lab Work: NONE If you have labs (blood work) drawn today and your tests are completely normal, you will receive your results only by: MyChart Message (if you have MyChart) OR A paper copy in the mail If you have any lab test that is abnormal or we need to change your treatment, we will call you to review the results.   Testing/Procedures: NONE   Follow-Up: At [facility], you and your health needs are our priority.  As part of our continuing mission to provide you with exceptional heart care, we have created designated Provider Care Teams.  These Care Teams include your primary Cardiologist (physician) and Advanced Practice Providers (APPs -  Physician Assistants and Nurse Practitioners) who all work together to provide you with the care you need, when you need it.  We recommend signing up for the patient portal called "MyChart".  Sign up information is provided on this After Visit Summary.  MyChart is used to connect with patients for Virtual Visits (Telemedicine).  Patients are able to view lab/test results, encounter notes, upcoming appointments, etc.  Non-urgent messages can be sent to your provider as well.   To learn more about what you can do with MyChart, go to [email or web address].    Your next appointment:   As Needed  The format for your next appointment:   In Person  Provider:   Brian Munley, MD    Other Instructions   Important Information About Sugar       

## 2022-06-28 DIAGNOSIS — H524 Presbyopia: Secondary | ICD-10-CM | POA: Diagnosis not present

## 2022-06-28 DIAGNOSIS — E119 Type 2 diabetes mellitus without complications: Secondary | ICD-10-CM | POA: Diagnosis not present

## 2022-08-18 DIAGNOSIS — E1140 Type 2 diabetes mellitus with diabetic neuropathy, unspecified: Secondary | ICD-10-CM | POA: Diagnosis not present

## 2022-08-23 DIAGNOSIS — I10 Essential (primary) hypertension: Secondary | ICD-10-CM | POA: Diagnosis not present

## 2022-08-23 DIAGNOSIS — E785 Hyperlipidemia, unspecified: Secondary | ICD-10-CM | POA: Diagnosis not present

## 2022-08-23 DIAGNOSIS — R7309 Other abnormal glucose: Secondary | ICD-10-CM | POA: Diagnosis not present

## 2022-08-23 DIAGNOSIS — E1140 Type 2 diabetes mellitus with diabetic neuropathy, unspecified: Secondary | ICD-10-CM | POA: Diagnosis not present

## 2022-09-29 DIAGNOSIS — M7062 Trochanteric bursitis, left hip: Secondary | ICD-10-CM | POA: Diagnosis not present

## 2022-09-29 DIAGNOSIS — M7061 Trochanteric bursitis, right hip: Secondary | ICD-10-CM | POA: Diagnosis not present

## 2022-10-05 DIAGNOSIS — R001 Bradycardia, unspecified: Secondary | ICD-10-CM | POA: Diagnosis not present

## 2022-10-05 DIAGNOSIS — Z853 Personal history of malignant neoplasm of breast: Secondary | ICD-10-CM | POA: Diagnosis not present

## 2022-10-05 DIAGNOSIS — R531 Weakness: Secondary | ICD-10-CM | POA: Diagnosis not present

## 2022-10-05 DIAGNOSIS — R0902 Hypoxemia: Secondary | ICD-10-CM | POA: Diagnosis not present

## 2022-10-05 DIAGNOSIS — I10 Essential (primary) hypertension: Secondary | ICD-10-CM | POA: Diagnosis not present

## 2022-10-05 DIAGNOSIS — Z8673 Personal history of transient ischemic attack (TIA), and cerebral infarction without residual deficits: Secondary | ICD-10-CM | POA: Diagnosis not present

## 2022-10-05 DIAGNOSIS — Z1152 Encounter for screening for COVID-19: Secondary | ICD-10-CM | POA: Diagnosis not present

## 2022-10-05 DIAGNOSIS — E7800 Pure hypercholesterolemia, unspecified: Secondary | ICD-10-CM | POA: Diagnosis not present

## 2022-10-05 DIAGNOSIS — Z79899 Other long term (current) drug therapy: Secondary | ICD-10-CM | POA: Diagnosis not present

## 2022-10-05 DIAGNOSIS — I499 Cardiac arrhythmia, unspecified: Secondary | ICD-10-CM | POA: Diagnosis not present

## 2022-10-05 DIAGNOSIS — Z794 Long term (current) use of insulin: Secondary | ICD-10-CM | POA: Diagnosis not present

## 2022-10-05 DIAGNOSIS — E119 Type 2 diabetes mellitus without complications: Secondary | ICD-10-CM | POA: Diagnosis not present

## 2022-10-05 DIAGNOSIS — R9431 Abnormal electrocardiogram [ECG] [EKG]: Secondary | ICD-10-CM | POA: Diagnosis not present

## 2022-10-05 DIAGNOSIS — T50901A Poisoning by unspecified drugs, medicaments and biological substances, accidental (unintentional), initial encounter: Secondary | ICD-10-CM | POA: Diagnosis not present

## 2023-01-09 DIAGNOSIS — E1140 Type 2 diabetes mellitus with diabetic neuropathy, unspecified: Secondary | ICD-10-CM | POA: Diagnosis not present

## 2023-01-09 DIAGNOSIS — I10 Essential (primary) hypertension: Secondary | ICD-10-CM | POA: Diagnosis not present

## 2023-01-09 DIAGNOSIS — E785 Hyperlipidemia, unspecified: Secondary | ICD-10-CM | POA: Diagnosis not present

## 2023-01-09 DIAGNOSIS — Z79899 Other long term (current) drug therapy: Secondary | ICD-10-CM | POA: Diagnosis not present

## 2023-01-13 DIAGNOSIS — E785 Hyperlipidemia, unspecified: Secondary | ICD-10-CM | POA: Diagnosis not present

## 2023-01-13 DIAGNOSIS — E1140 Type 2 diabetes mellitus with diabetic neuropathy, unspecified: Secondary | ICD-10-CM | POA: Diagnosis not present

## 2023-01-13 DIAGNOSIS — R7309 Other abnormal glucose: Secondary | ICD-10-CM | POA: Diagnosis not present

## 2023-04-09 DIAGNOSIS — J324 Chronic pansinusitis: Secondary | ICD-10-CM | POA: Diagnosis not present

## 2023-04-09 DIAGNOSIS — L089 Local infection of the skin and subcutaneous tissue, unspecified: Secondary | ICD-10-CM | POA: Diagnosis not present

## 2023-04-09 DIAGNOSIS — L03211 Cellulitis of face: Secondary | ICD-10-CM | POA: Diagnosis not present

## 2023-04-12 DIAGNOSIS — L299 Pruritus, unspecified: Secondary | ICD-10-CM | POA: Diagnosis not present

## 2023-04-12 DIAGNOSIS — B029 Zoster without complications: Secondary | ICD-10-CM | POA: Diagnosis not present

## 2023-04-13 DIAGNOSIS — B0231 Zoster conjunctivitis: Secondary | ICD-10-CM | POA: Diagnosis not present

## 2023-04-20 DIAGNOSIS — B0231 Zoster conjunctivitis: Secondary | ICD-10-CM | POA: Diagnosis not present

## 2023-04-24 DIAGNOSIS — E1140 Type 2 diabetes mellitus with diabetic neuropathy, unspecified: Secondary | ICD-10-CM | POA: Diagnosis not present

## 2023-04-26 DIAGNOSIS — B029 Zoster without complications: Secondary | ICD-10-CM | POA: Diagnosis not present

## 2023-05-09 DIAGNOSIS — Z23 Encounter for immunization: Secondary | ICD-10-CM | POA: Diagnosis not present

## 2023-05-09 DIAGNOSIS — E1140 Type 2 diabetes mellitus with diabetic neuropathy, unspecified: Secondary | ICD-10-CM | POA: Diagnosis not present

## 2023-08-22 DIAGNOSIS — E1140 Type 2 diabetes mellitus with diabetic neuropathy, unspecified: Secondary | ICD-10-CM | POA: Diagnosis not present

## 2023-08-24 DIAGNOSIS — I2601 Septic pulmonary embolism with acute cor pulmonale: Secondary | ICD-10-CM | POA: Diagnosis not present

## 2023-08-24 DIAGNOSIS — E1040 Type 1 diabetes mellitus with diabetic neuropathy, unspecified: Secondary | ICD-10-CM | POA: Diagnosis not present

## 2023-08-30 DIAGNOSIS — T383X5A Adverse effect of insulin and oral hypoglycemic [antidiabetic] drugs, initial encounter: Secondary | ICD-10-CM | POA: Diagnosis not present

## 2023-08-30 DIAGNOSIS — R9431 Abnormal electrocardiogram [ECG] [EKG]: Secondary | ICD-10-CM | POA: Diagnosis not present

## 2023-08-30 DIAGNOSIS — E162 Hypoglycemia, unspecified: Secondary | ICD-10-CM | POA: Diagnosis not present

## 2023-08-30 DIAGNOSIS — Z79899 Other long term (current) drug therapy: Secondary | ICD-10-CM | POA: Diagnosis not present

## 2023-08-30 DIAGNOSIS — E11649 Type 2 diabetes mellitus with hypoglycemia without coma: Secondary | ICD-10-CM | POA: Diagnosis not present

## 2023-08-30 DIAGNOSIS — Z7901 Long term (current) use of anticoagulants: Secondary | ICD-10-CM | POA: Diagnosis not present

## 2023-08-30 DIAGNOSIS — Z794 Long term (current) use of insulin: Secondary | ICD-10-CM | POA: Diagnosis not present

## 2023-08-31 DIAGNOSIS — E162 Hypoglycemia, unspecified: Secondary | ICD-10-CM | POA: Diagnosis not present

## 2023-08-31 DIAGNOSIS — R918 Other nonspecific abnormal finding of lung field: Secondary | ICD-10-CM | POA: Diagnosis not present

## 2023-08-31 DIAGNOSIS — R9431 Abnormal electrocardiogram [ECG] [EKG]: Secondary | ICD-10-CM | POA: Diagnosis not present

## 2023-11-23 DIAGNOSIS — E1140 Type 2 diabetes mellitus with diabetic neuropathy, unspecified: Secondary | ICD-10-CM | POA: Diagnosis not present

## 2023-12-07 DIAGNOSIS — I10 Essential (primary) hypertension: Secondary | ICD-10-CM | POA: Diagnosis not present

## 2023-12-07 DIAGNOSIS — K5900 Constipation, unspecified: Secondary | ICD-10-CM | POA: Diagnosis not present

## 2023-12-07 DIAGNOSIS — E1140 Type 2 diabetes mellitus with diabetic neuropathy, unspecified: Secondary | ICD-10-CM | POA: Diagnosis not present

## 2023-12-19 DIAGNOSIS — E119 Type 2 diabetes mellitus without complications: Secondary | ICD-10-CM | POA: Diagnosis not present

## 2024-02-07 DIAGNOSIS — H25013 Cortical age-related cataract, bilateral: Secondary | ICD-10-CM | POA: Diagnosis not present

## 2024-02-07 DIAGNOSIS — H2513 Age-related nuclear cataract, bilateral: Secondary | ICD-10-CM | POA: Diagnosis not present

## 2024-02-07 DIAGNOSIS — H2511 Age-related nuclear cataract, right eye: Secondary | ICD-10-CM | POA: Diagnosis not present

## 2024-02-07 DIAGNOSIS — I10 Essential (primary) hypertension: Secondary | ICD-10-CM | POA: Diagnosis not present

## 2024-04-17 DIAGNOSIS — I10 Essential (primary) hypertension: Secondary | ICD-10-CM | POA: Diagnosis not present

## 2024-04-17 DIAGNOSIS — H2513 Age-related nuclear cataract, bilateral: Secondary | ICD-10-CM | POA: Diagnosis not present

## 2024-04-30 ENCOUNTER — Encounter: Payer: Self-pay | Admitting: *Deleted

## 2024-04-30 NOTE — Progress Notes (Signed)
 Olivia Mora                                          MRN: [id number]   04/30/2024   The [facility]BCI Quality Team Specialist reviewed this patient medical record for the purposes of chart review for care gap closure. The following were reviewed: chart review for care gap closure-kidney health evaluation for diabetes:eGFR  and uACR.    VBCI Quality Team
